# Patient Record
Sex: Female | Born: 1955 | Race: White | Hispanic: No | Smoking: Current every day smoker
Health system: Southern US, Community
[De-identification: ages and names within clinical notes are randomized; demographics above are authoritative.]

## PROBLEM LIST (undated history)

## (undated) DIAGNOSIS — I639 Cerebral infarction, unspecified: Secondary | ICD-10-CM

## (undated) DIAGNOSIS — I219 Acute myocardial infarction, unspecified: Secondary | ICD-10-CM

## (undated) DIAGNOSIS — R296 Repeated falls: Secondary | ICD-10-CM

## (undated) HISTORY — PX: CARPAL TUNNEL RELEASE: SHX101

## (undated) HISTORY — PX: ABDOMINAL HYSTERECTOMY: SHX81

---

## 2001-10-28 ENCOUNTER — Emergency Department (HOSPITAL_COMMUNITY): Admission: EM | Admit: 2001-10-28 | Discharge: 2001-10-29 | Payer: Self-pay | Admitting: Emergency Medicine

## 2001-10-29 ENCOUNTER — Encounter: Payer: Self-pay | Admitting: Emergency Medicine

## 2001-10-30 ENCOUNTER — Emergency Department (HOSPITAL_COMMUNITY): Admission: EM | Admit: 2001-10-30 | Discharge: 2001-10-31 | Payer: Self-pay | Admitting: Internal Medicine

## 2002-01-01 ENCOUNTER — Emergency Department (HOSPITAL_COMMUNITY): Admission: EM | Admit: 2002-01-01 | Discharge: 2002-01-01 | Payer: Self-pay | Admitting: *Deleted

## 2002-03-31 ENCOUNTER — Emergency Department (HOSPITAL_COMMUNITY): Admission: EM | Admit: 2002-03-31 | Discharge: 2002-04-01 | Payer: Self-pay | Admitting: Emergency Medicine

## 2002-04-01 ENCOUNTER — Encounter: Payer: Self-pay | Admitting: Emergency Medicine

## 2002-07-11 ENCOUNTER — Emergency Department (HOSPITAL_COMMUNITY): Admission: EM | Admit: 2002-07-11 | Discharge: 2002-07-12 | Payer: Self-pay | Admitting: Emergency Medicine

## 2002-08-29 ENCOUNTER — Emergency Department (HOSPITAL_COMMUNITY): Admission: EM | Admit: 2002-08-29 | Discharge: 2002-08-29 | Payer: Self-pay | Admitting: *Deleted

## 2004-02-13 ENCOUNTER — Emergency Department (HOSPITAL_COMMUNITY): Admission: EM | Admit: 2004-02-13 | Discharge: 2004-02-13 | Payer: Self-pay | Admitting: Emergency Medicine

## 2005-12-17 ENCOUNTER — Emergency Department (HOSPITAL_COMMUNITY): Admission: EM | Admit: 2005-12-17 | Discharge: 2005-12-17 | Payer: Self-pay | Admitting: Emergency Medicine

## 2006-09-13 ENCOUNTER — Emergency Department (HOSPITAL_COMMUNITY): Admission: EM | Admit: 2006-09-13 | Discharge: 2006-09-14 | Payer: Self-pay | Admitting: Emergency Medicine

## 2006-10-03 ENCOUNTER — Emergency Department (HOSPITAL_COMMUNITY): Admission: EM | Admit: 2006-10-03 | Discharge: 2006-10-03 | Payer: Self-pay | Admitting: Emergency Medicine

## 2006-11-12 ENCOUNTER — Emergency Department (HOSPITAL_COMMUNITY): Admission: EM | Admit: 2006-11-12 | Discharge: 2006-11-12 | Payer: Self-pay | Admitting: Emergency Medicine

## 2006-11-27 ENCOUNTER — Emergency Department (HOSPITAL_COMMUNITY): Admission: EM | Admit: 2006-11-27 | Discharge: 2006-11-27 | Payer: Self-pay | Admitting: Emergency Medicine

## 2007-05-08 ENCOUNTER — Emergency Department (HOSPITAL_COMMUNITY): Admission: EM | Admit: 2007-05-08 | Discharge: 2007-05-08 | Payer: Self-pay | Admitting: Emergency Medicine

## 2007-09-23 ENCOUNTER — Emergency Department (HOSPITAL_COMMUNITY): Admission: EM | Admit: 2007-09-23 | Discharge: 2007-09-23 | Payer: Self-pay | Admitting: Emergency Medicine

## 2008-08-30 ENCOUNTER — Emergency Department (HOSPITAL_COMMUNITY): Admission: EM | Admit: 2008-08-30 | Discharge: 2008-08-30 | Payer: Self-pay | Admitting: Emergency Medicine

## 2009-03-28 ENCOUNTER — Inpatient Hospital Stay (HOSPITAL_COMMUNITY): Admission: EM | Admit: 2009-03-28 | Discharge: 2009-03-30 | Payer: Self-pay | Admitting: Emergency Medicine

## 2009-04-22 ENCOUNTER — Emergency Department (HOSPITAL_COMMUNITY): Admission: EM | Admit: 2009-04-22 | Discharge: 2009-04-23 | Payer: Self-pay | Admitting: Emergency Medicine

## 2010-06-25 IMAGING — CR DG CHEST 2V
2 series · 2 of 2 positions shown · non-contrast
Comparison: 12/17/2005

CLINICAL DATA: Chest pain.  Vomiting and diarrhea.  Cough.

CHEST - 2 VIEW

[view not recorded (1 of 2)]
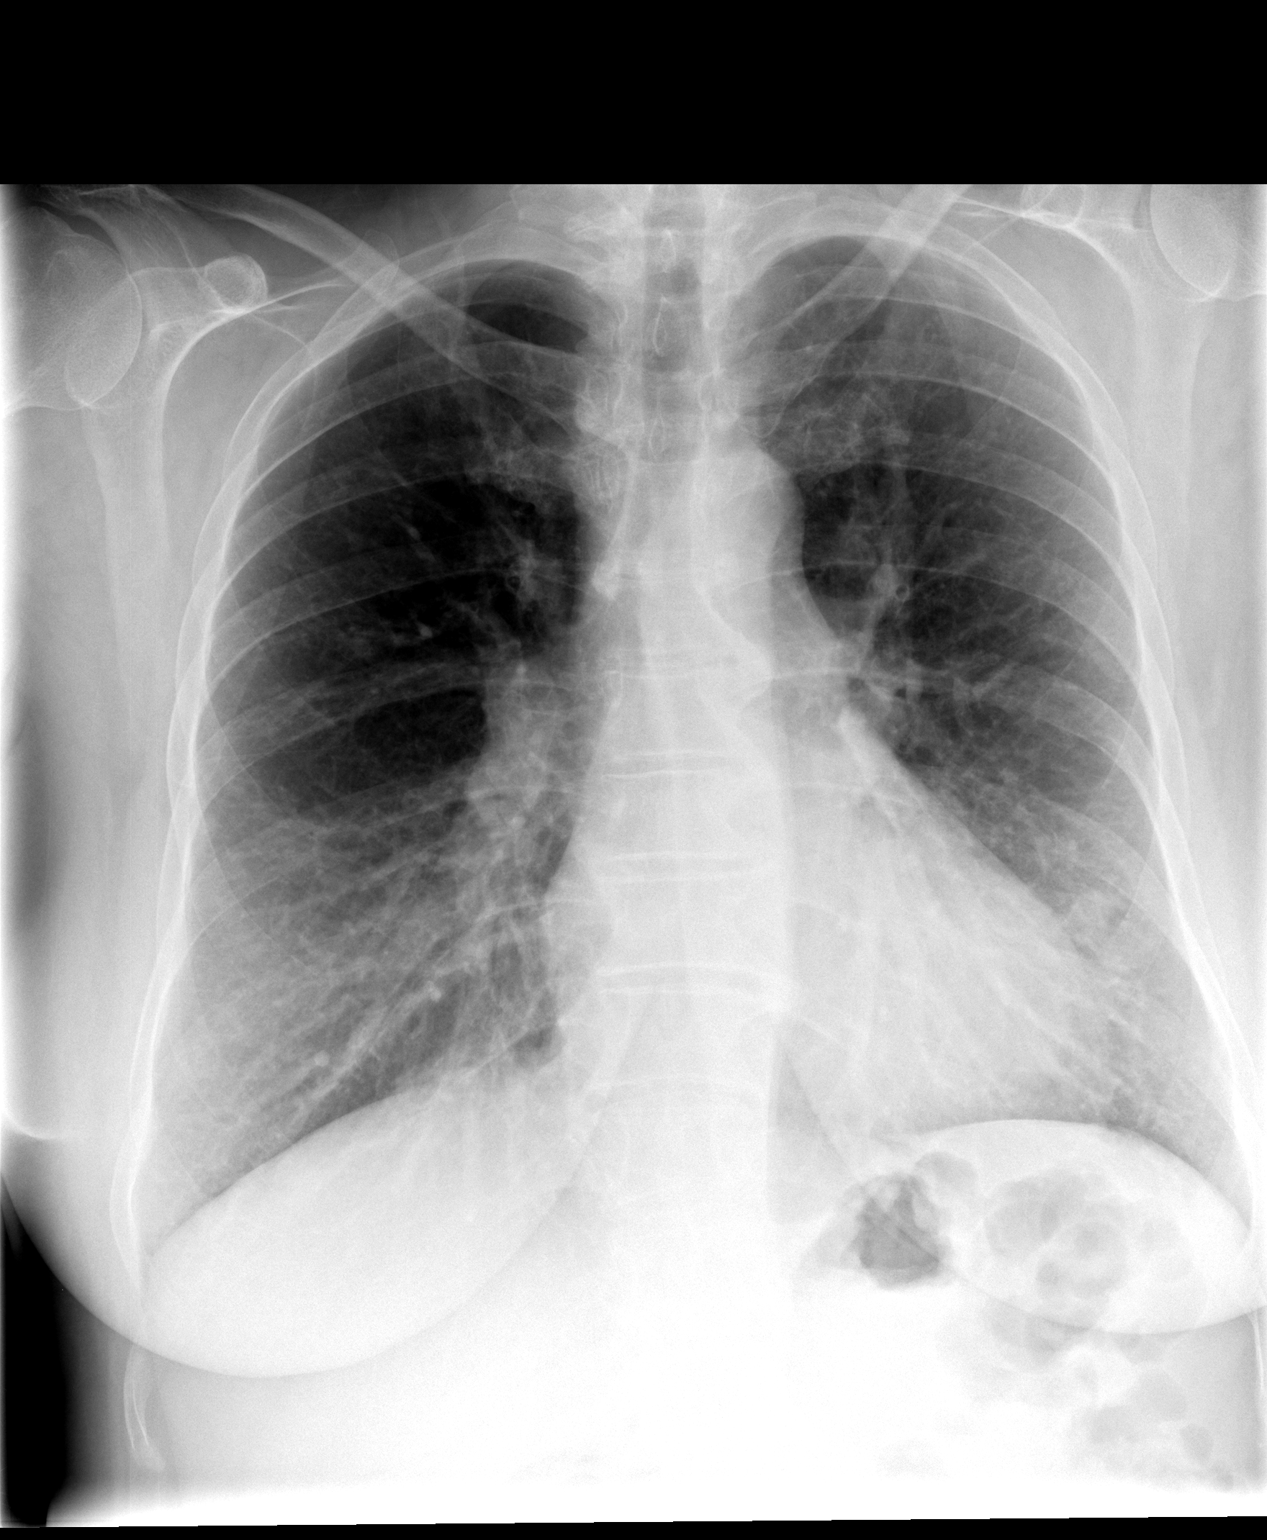

[view not recorded (2 of 2)]
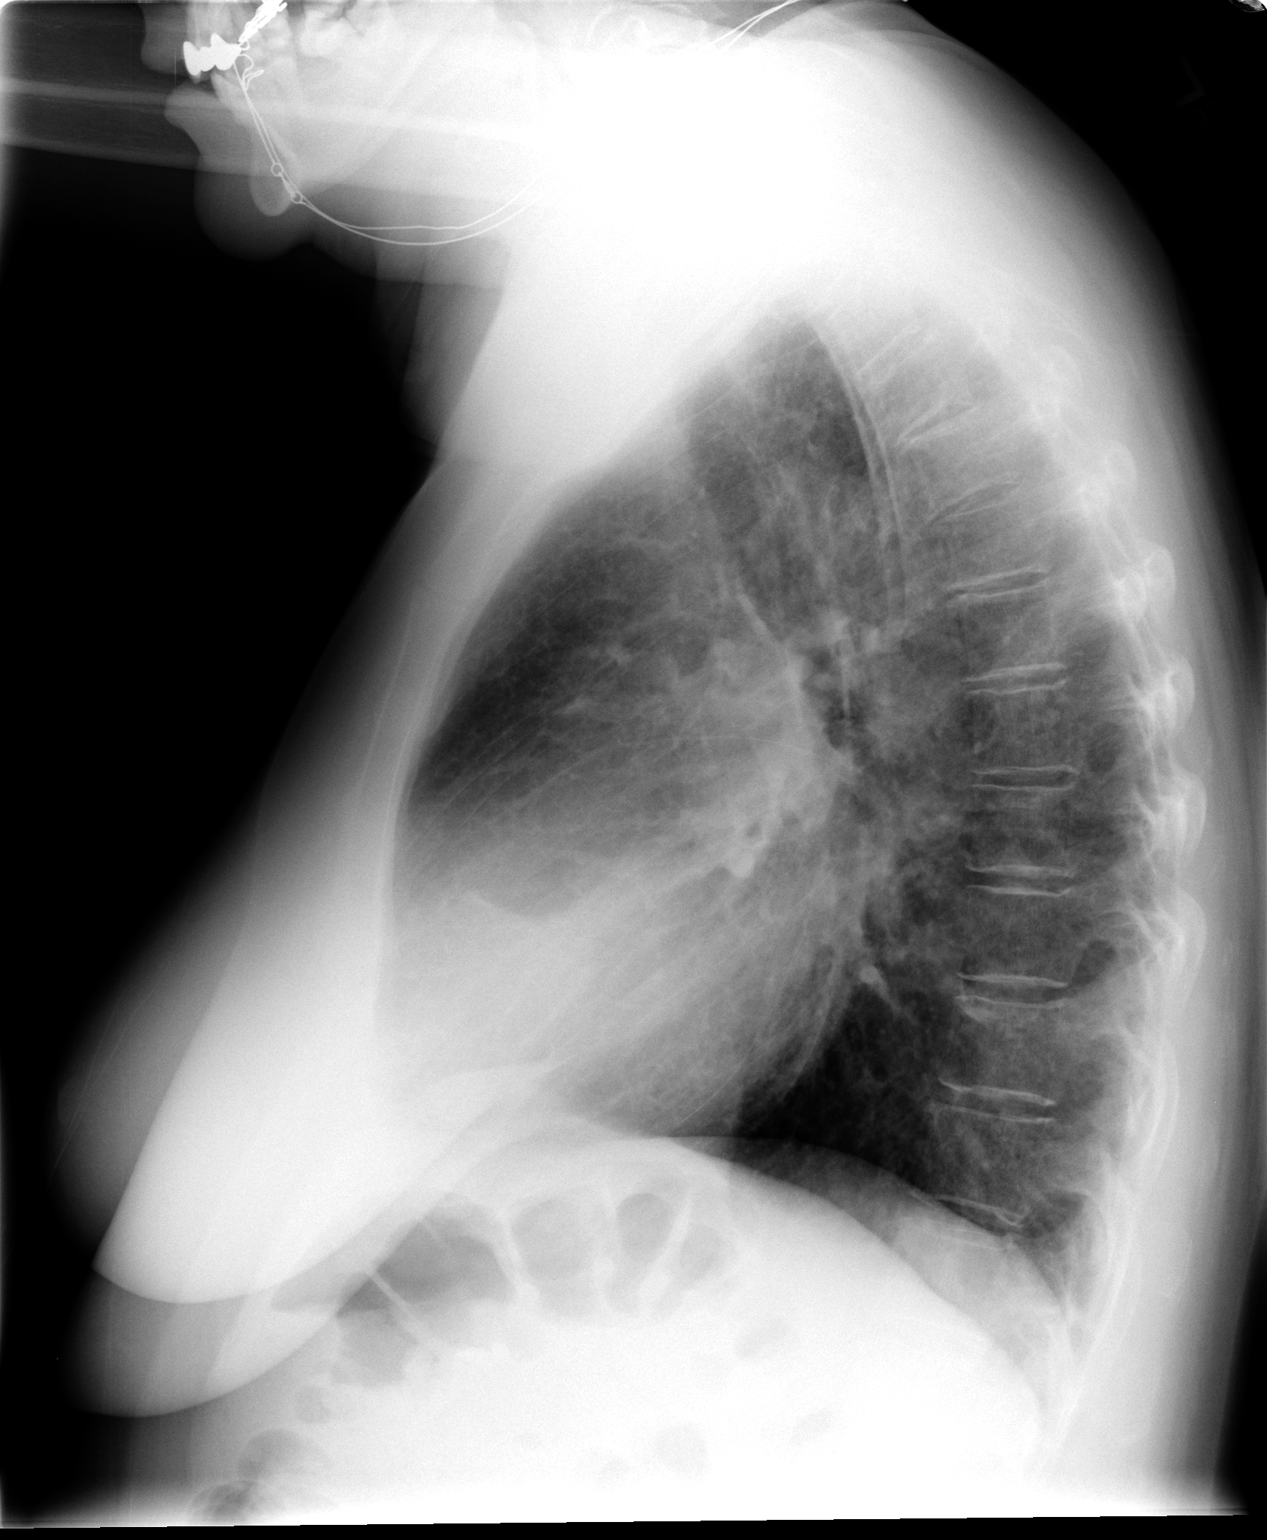

[2 of 2 positions shown; findings below may reference images not displayed]

FINDINGS: Midline trachea. Mild cardiomegaly. Mediastinal contours
otherwise within normal limits.  No pleural effusion or
pneumothorax. Diffuse peribronchial thickening.  Along the left
heart border, an area of increased density.  This may correspond to
a vague increased density over the heart on the lateral view.
IMPRESSION: 1.  Vague increased density within the lingula.  This may represent
confluence of moderate peribronchial thickening or atelectasis.
Given the history of cough, early infection cannot be excluded.
Recommend plain film follow-up.  If they are not infectious
symptoms, consider further evaluation with chest CT to exclude the
lung nodule.
2.  Cardiomegaly without congestive failure.
3. Peribronchial thickening which may relate to chronic bronchitis
or smoking.

## 2011-02-25 LAB — DIFFERENTIAL
Basophils Absolute: 0 10*3/uL (ref 0.0–0.1)
Eosinophils Absolute: 0 10*3/uL (ref 0.0–0.7)
Eosinophils Relative: 0 % (ref 0–5)
Eosinophils Relative: 0 % (ref 0–5)
Lymphocytes Relative: 11 % — ABNORMAL LOW (ref 12–46)
Lymphs Abs: 1 10*3/uL (ref 0.7–4.0)
Metamyelocytes Relative: 0 %
Monocytes Absolute: 0.1 10*3/uL (ref 0.1–1.0)
Monocytes Relative: 1 % — ABNORMAL LOW (ref 3–12)
Myelocytes: 0 %
Neutro Abs: 8.3 10*3/uL — ABNORMAL HIGH (ref 1.7–7.7)
Neutrophils Relative %: 73 % (ref 43–77)
Promyelocytes Absolute: 0 %
nRBC: 0 /100 WBC

## 2011-02-25 LAB — COMPREHENSIVE METABOLIC PANEL
ALT: 8 U/L (ref 0–35)
ALT: <8 U/L (ref 0–35)
AST: 10 U/L (ref 0–37)
AST: 14 U/L (ref 0–37)
Albumin: 2.8 g/dL — ABNORMAL LOW (ref 3.5–5.2)
Alkaline Phosphatase: 113 U/L (ref 39–117)
CO2: 30 mEq/L (ref 19–32)
Calcium: 9 mg/dL (ref 8.4–10.5)
Chloride: 107 mEq/L (ref 96–112)
Creatinine, Ser: 0.54 mg/dL (ref 0.4–1.2)
GFR calc Af Amer: >60 mL/min (ref >60)
GFR calc Af Amer: >60 mL/min (ref >60)
GFR calc non Af Amer: >60 mL/min (ref >60)
GFR calc non Af Amer: >60 mL/min (ref >60)
Glucose, Bld: 108 mg/dL — ABNORMAL HIGH (ref 70–99)
Potassium: 3.1 mEq/L — ABNORMAL LOW (ref 3.5–5.1)
Sodium: 143 mEq/L (ref 135–145)
Sodium: 144 mEq/L (ref 135–145)
Total Protein: 6.3 g/dL (ref 6.0–8.3)

## 2011-02-25 LAB — CBC
Hemoglobin: 14.3 g/dL (ref 12.0–15.0)
MCHC: 33.5 g/dL (ref 30.0–36.0)
MCV: 102 fL — ABNORMAL HIGH (ref 78.0–100.0)
Platelets: 323 10*3/uL (ref 150–400)
RBC: 4.1 MIL/uL (ref 3.87–5.11)
RDW: 16 % — ABNORMAL HIGH (ref 11.5–15.5)
WBC: 11.4 10*3/uL — ABNORMAL HIGH (ref 4.0–10.5)

## 2011-02-25 LAB — BLOOD GAS, ARTERIAL
Bicarbonate: 28.3 mEq/L — ABNORMAL HIGH (ref 20.0–24.0)
FIO2: 0.21 %
O2 Saturation: 86.2 %
Patient temperature: 37
pH, Arterial: 7.358 (ref 7.350–7.400)
pO2, Arterial: 49.2 mmHg — ABNORMAL LOW (ref 80.0–100.0)

## 2011-02-25 LAB — T4, FREE: Free T4: 0.9 ng/dL (ref 0.80–1.80)

## 2011-03-27 ENCOUNTER — Emergency Department (HOSPITAL_COMMUNITY): Payer: No Typology Code available for payment source

## 2011-03-27 ENCOUNTER — Emergency Department (HOSPITAL_COMMUNITY)
Admission: EM | Admit: 2011-03-27 | Discharge: 2011-03-27 | Disposition: A | Discharge status: Home / Self Care | Payer: No Typology Code available for payment source | Attending: Emergency Medicine | Admitting: Emergency Medicine

## 2011-03-27 DIAGNOSIS — R071 Chest pain on breathing: Secondary | ICD-10-CM | POA: Insufficient documentation

## 2011-03-27 DIAGNOSIS — M25569 Pain in unspecified knee: Secondary | ICD-10-CM | POA: Insufficient documentation

## 2011-03-27 DIAGNOSIS — IMO0002 Reserved for concepts with insufficient information to code with codable children: Secondary | ICD-10-CM | POA: Insufficient documentation

## 2011-03-27 DIAGNOSIS — Y92410 Unspecified street and highway as the place of occurrence of the external cause: Secondary | ICD-10-CM | POA: Insufficient documentation

## 2011-03-27 DIAGNOSIS — S298XXA Other specified injuries of thorax, initial encounter: Secondary | ICD-10-CM | POA: Insufficient documentation

## 2011-03-27 DIAGNOSIS — M542 Cervicalgia: Secondary | ICD-10-CM | POA: Insufficient documentation

## 2011-04-01 NOTE — Discharge Summary (Signed)
[name], [name]                 ACCOUNT NO.:  [id number]   MEDICAL RECORD NO.:  [id number]          PATIENT TYPE:  INP   LOCATION:  A314                          FACILITY:  [facility]   PHYSICIAN:  Osvaldo Shipper, MD     DATE OF BIRTH:  Jul 11, 1956   DATE OF ADMISSION:  03/28/2009  DATE OF DISCHARGE:  05/14/2010LH                               DISCHARGE SUMMARY   The patient does not have a primary medical doctor.   Please review the H and P dictated at the time of admission for details  regarding the patient's presenting illness.   DISCHARGE DIAGNOSES:  1. Acute bronchitis in the setting of tobacco abuse.  2. Gastroenteritis, resolved.  3. History of migraine headaches.  4. Macrocytosis with normal B12 and normal free T4.   BRIEF HOSPITAL COURSE:  Briefly, this is a 55 year old Caucasian female  who presented with complaints of shortness of breath and nausea,  vomiting, diarrhea.  She was felt to have two processes ongoing at the  same time.  She was thought to have acute bronchitis for which she was  treated with steroids, antibiotics, and nebulizer treatments.  She was  also thought to have acute gastroenteritis, which is thought to be  viral.  The gastroenteritis improved quickly; however, the bronchitis  persisted.  The patient continued to receive nebulizer treatments.  Over  the last 12 hours, she is feeling better.  Cough is improved.  Wheezing  is improved.  She is able to ambulate with no difficulties.  So, I think  at this point we can continue rest of the treatment at home.   On the day of discharge, otherwise she feels well.  She is requesting  narcotic pain medicines for her migraines, which I have declined.   PHYSICAL EXAMINATION:  VITAL SIGNS:  Temperature 97.9, heart rate 82,  blood pressure 144/89, respiratory rate is 20, saturation 98% on room  air.  LUNGS:  Scattered wheezing bilaterally.  No crackles are present.  CARDIOVASCULAR:  S1, S2 is normal, regular.   No murmurs appreciated.  No  S3, S4.  No rubs.  No bruits.  ABDOMEN:  Soft, nontender, nondistended.   LABORATORY DATA:  No labs done today.   IMAGING STUDIES DURING THIS ADMISSION:  Chest x-ray, which showed vague  increased density within the lingula and they could not exclude early  infection.  A followup chest x-ray was recommended, so we will write her  a prescription to get this chest x-ray in about 4-6 weeks.   DISCHARGE MEDICATIONS:  1. Amoxicillin 500 mg p.o. t.i.d. for 5 days.  2. Nicotine patch 21 mg per day.  3. Prednisone 40 mg daily for 3 days followed by 30 mg for 3 days,      followed 20 daily for 3 days, followed 10 mg daily for 3 days.  4. Albuterol nebulizers 2.5 mg every 6 hours for 5 days and then as      needed.   FOLLOWUP:  She does not have a primary medical doctor, and she is also  uninsured, here we will try  to arrange followup with one of the  unassigned physicians.   Chest x-ray should be obtained in 4-6 weeks and I will write her a  prescription for the same.  I will actually try to schedule an  appointment for her as well.   DIET:  No restrictions.   PHYSICAL ACTIVITY:  No restrictions.  She was counseled regarding  smoking cessation.   TOTAL TIME OF THIS ENCOUNTER:  35 minutes.      Osvaldo Shipper, MD  Electronically Signed     GK/MEDQ  D:  03/30/2009  T:  03/31/2009  Job:  [id number]

## 2011-04-01 NOTE — H&P (Signed)
[name], [name]                 ACCOUNT NO.:  [id number]   MEDICAL RECORD NO.:  [id number]          PATIENT TYPE:  INP   LOCATION:  A314                          FACILITY:  [facility]   PHYSICIAN:  Renee Ramus, MD       DATE OF BIRTH:  October 16, 1956   DATE OF ADMISSION:  03/28/2009  DATE OF DISCHARGE:  LH                              HISTORY & PHYSICAL   The patient does not have her primary care physician.  She is  unassigned.   HISTORY OF PRESENT ILLNESS:  The patient is a 55 year old female with  complaints of cough, mild fevers, and dyspnea x3-4 days prior to  admission.  In addition to that, she has had vomiting and diarrhea,  which has been nonbloody and occurring for approximately 4-5 days prior  to admission.  The patient does have known tobacco abuse history.  She  has not been formally diagnosed with COPD, but certainly has risk  factors for this.  She has been admitted for further evaluation and  treatment of likely COPD exacerbation.  The patient denies chest pain,  PND, or orthopnea.  The patient denies previous history of these  symptoms.  The patient denies bloody diarrhea.  Denies hematemesis.  The  patient is currently afebrile.   PAST MEDICAL HISTORY:  1. Migraine headaches.  2. Tobacco abuse.   SOCIAL HISTORY:  The patient reports smoking 1 pack per day.  Denies  alcohol use.  Lives alone at home.   FAMILY HISTORY:  Not available.   REVIEW OF SYSTEMS:  All other comprehensive review of systems are  negative.   The patient has no known drug allergies.   MEDICATIONS:  She is currently not taking any home medications.   PHYSICAL EXAMINATION:  GENERAL:  This is a well-developed, well-  nourished white female currently in no apparent distress.  VITAL SIGNS:  Blood pressure 96/50, heart rate 86, respiratory rate 20,  temperature 97.4.  HEENT:  No jugular venous distention or lymphadenopathy.  Oropharynx is  clear.  Mucous membranes pink and moist.  TMs clear  bilaterally.  Pupils  equal, round, and reactive to light and accommodation.  Extraocular  muscles were intact.  CARDIOVASCULAR:  Regular rate and rhythm without murmurs, rubs, or  gallops.  PULMONARY:  Lungs are clear to auscultation bilaterally with marked  decreased breath sounds in the basis.  ABDOMEN:  Soft, nontender, nondistended without hepatosplenomegaly.  Bowel sounds are present.  She has no rebound or guarding.  EXTREMITIES:  She has no clubbing, cyanosis, or edema.  She has good  peripheral pulses, dorsalis pedis, and radial arteries.  She is able to  move all extremities.  NEURO:  Cranial nerves II to XII are grossly intact.  She has no focal  neurological deficits.   STUDIES:  Chest x-ray shows chronic bronchitis with evidence of  cardiomegaly and stable left inferior lingular density.   LABORATORY DATA:  ABG done on room air shows pH 7.35, PO2 51, PCO2 49,  white count 11.4, H and H 14 and 41, MCV 100.7, and platelets 341.  Sodium 143, potassium 3.1, chloride 107, bicarb 30, BUN 4, creatinine  0.5, glucose 108.   ASSESSMENT AND PLAN:  1. Bronchitis.  The patient likely is experiencing acute chronic      obstructive pulmonary disease exacerbation with underlying      infection.  This may or may not be viral, but we will treat      empirically with antibiotics.  We will use albuterol and Atrovent      nebs.  We will also treat with steroids with taper and reevaluate      in a.m.  2. Tobacco abuse.  The patient will be placed on nicotine patch.  3. Nausea, vomiting, diarrhea, likely this is viral gastroenteritis.      The patient does not have history of colitis and currently does not      have evidence of bloody diarrhea.  I will place her on clear      liquids initially, advance as tolerated and give her Imodium for      her diarrhea.  4. Hypokalemia.  We use p.o. potassium replacement as well as 20 mEq/L      and IV fluid.  5. Macrocytosis.  We will check B12,  folate, and thyroid including TSH      and free T4.  6. Migraine headaches, currently stable.   DISPOSITION:  The patient is a full code.  H and P was constructed by  reviewing past medical history, confirming with emergency medical room  physician, and reviewing the emergency medical record.   TIME SPENT:  1 hour.      Renee Ramus, MD  Electronically Signed     JF/MEDQ  D:  03/28/2009  T:  03/29/2009  Job:  [id number]

## 2011-04-05 ENCOUNTER — Emergency Department (HOSPITAL_COMMUNITY)
Admission: EM | Admit: 2011-04-05 | Discharge: 2011-04-06 | Disposition: A | Discharge status: Home / Self Care | Payer: Self-pay | Attending: Emergency Medicine | Admitting: Emergency Medicine

## 2011-04-05 ENCOUNTER — Emergency Department (HOSPITAL_COMMUNITY): Payer: Self-pay

## 2011-04-05 DIAGNOSIS — R079 Chest pain, unspecified: Secondary | ICD-10-CM | POA: Insufficient documentation

## 2011-04-05 DIAGNOSIS — R071 Chest pain on breathing: Secondary | ICD-10-CM | POA: Insufficient documentation

## 2011-08-18 LAB — WOUND CULTURE: Gram Stain: NONE SEEN

## 2012-07-03 DIAGNOSIS — R0602 Shortness of breath: Secondary | ICD-10-CM

## 2012-11-10 ENCOUNTER — Encounter (HOSPITAL_COMMUNITY): Payer: Self-pay | Admitting: Emergency Medicine

## 2012-11-10 ENCOUNTER — Emergency Department (HOSPITAL_COMMUNITY)
Admission: EM | Admit: 2012-11-10 | Discharge: 2012-11-11 | Disposition: A | Discharge status: Home / Self Care | Payer: Self-pay | Attending: Emergency Medicine | Admitting: Emergency Medicine

## 2012-11-10 DIAGNOSIS — I252 Old myocardial infarction: Secondary | ICD-10-CM | POA: Insufficient documentation

## 2012-11-10 DIAGNOSIS — R634 Abnormal weight loss: Secondary | ICD-10-CM

## 2012-11-10 DIAGNOSIS — R112 Nausea with vomiting, unspecified: Secondary | ICD-10-CM

## 2012-11-10 DIAGNOSIS — R109 Unspecified abdominal pain: Secondary | ICD-10-CM

## 2012-11-10 DIAGNOSIS — F17200 Nicotine dependence, unspecified, uncomplicated: Secondary | ICD-10-CM | POA: Insufficient documentation

## 2012-11-10 HISTORY — DX: Acute myocardial infarction, unspecified: I21.9

## 2012-11-10 LAB — COMPREHENSIVE METABOLIC PANEL
ALT: 46 U/L — ABNORMAL HIGH (ref 0–35)
Alkaline Phosphatase: 186 U/L — ABNORMAL HIGH (ref 39–117)
BUN: 15 mg/dL (ref 6–23)
CO2: 30 mEq/L (ref 19–32)
Calcium: 9.3 mg/dL (ref 8.4–10.5)
GFR calc Af Amer: >90 mL/min (ref >90)
GFR calc non Af Amer: >90 mL/min (ref >90)
Glucose, Bld: 127 mg/dL — ABNORMAL HIGH (ref 70–99)
Sodium: 138 mEq/L (ref 135–145)

## 2012-11-10 LAB — CBC WITH DIFFERENTIAL/PLATELET
Basophils Relative: 0 % (ref 0–1)
Eosinophils Relative: 0 % (ref 0–5)
Lymphocytes Relative: 17 % (ref 12–46)
MCHC: 33.6 g/dL (ref 30.0–36.0)
Monocytes Absolute: 0.6 10*3/uL (ref 0.1–1.0)
Neutro Abs: 6 10*3/uL (ref 1.7–7.7)
Platelets: 358 10*3/uL (ref 150–400)
RDW: 13.5 % (ref 11.5–15.5)
WBC: 8 10*3/uL (ref 4.0–10.5)

## 2012-11-10 MED ORDER — METOCLOPRAMIDE HCL 5 MG/ML IJ SOLN
10.0000 mg | Freq: Once | INTRAMUSCULAR | Status: AC
  Administered 2012-11-10: 10 mg via INTRAVENOUS
  Filled 2012-11-10: qty 2

## 2012-11-10 MED ORDER — KETOROLAC TROMETHAMINE 30 MG/ML IJ SOLN
30.0000 mg | Freq: Once | INTRAMUSCULAR | Status: AC
  Administered 2012-11-10: 30 mg via INTRAVENOUS
  Filled 2012-11-10: qty 1

## 2012-11-10 MED ORDER — SODIUM CHLORIDE 0.9 % IV BOLUS (SEPSIS)
1000.0000 mL | Freq: Once | INTRAVENOUS | Status: AC
  Administered 2012-11-10: 1000 mL via INTRAVENOUS

## 2012-11-10 MED ORDER — DIPHENHYDRAMINE HCL 50 MG/ML IJ SOLN
12.5000 mg | Freq: Once | INTRAMUSCULAR | Status: AC
  Administered 2012-11-10: 12.5 mg via INTRAVENOUS
  Filled 2012-11-10: qty 1

## 2012-11-10 MED ORDER — DIPHENHYDRAMINE HCL 12.5 MG/5ML PO ELIX
12.5000 mg | ORAL_SOLUTION | Freq: Once | ORAL | Status: DC
  Filled 2012-11-10: qty 5

## 2012-11-10 NOTE — ED Notes (Signed)
Patient complaining of fever and vomiting for over 1 week.

## 2012-11-10 NOTE — ED Provider Notes (Signed)
History  This chart was scribed for Donnetta Hutching, MD by Manuela Schwartz, ED scribe. This patient was seen in room [facility]03/[facility] and the patient's care was started at 2040.   CSN: [id number]  Arrival date & time 11/10/12  2040   First MD Initiated Contact with Patient 11/10/12 2148      Chief Complaint  Patient presents with  . Fever  . Emesis   Patient is a 56 y.o. female presenting with fever and vomiting. The history is provided by the patient. No language interpreter was used.  Fever Primary symptoms of the febrile illness include nausea and vomiting. Primary symptoms do not include fever, shortness of breath or abdominal pain. The current episode started 6 to 7 days ago. This is a new problem. The problem has been gradually worsening.  Emesis  Pertinent negatives include no abdominal pain, no chills and no fever.   [name] is a 56 y.o. female who presents to the Emergency Department complaining of constant gradually worsening emesis episodes, fever, over the past week. She states also lost about 20 lbs over the past week. She states tried tylenol, vicodin, and Theraflu at home w/minimal relief.   She is an everyday smoker.  Past Medical History  Diagnosis Date  . Heart attack     Past Surgical History  Procedure Date  . Abdominal hysterectomy   . Carpal tunnel release     History reviewed. No pertinent family history.  History  Substance Use Topics  . Smoking status: Current Every Day Smoker  . Smokeless tobacco: Not on file  . Alcohol Use: No    OB History    Grav Para Term Preterm Abortions TAB SAB Ect Mult Living                  Review of Systems  Constitutional: Positive for unexpected weight change (lost approx 20 lbs over past week). Negative for fever and chills.  Respiratory: Negative for shortness of breath.   Cardiovascular: Negative for chest pain.  Gastrointestinal: Positive for nausea and vomiting. Negative for abdominal pain.  Neurological:  Negative for weakness.  All other systems reviewed and are negative.    Allergies  Review of patient's allergies indicates no known allergies.  Home Medications   Current Outpatient Rx  Name  Route  Sig  Dispense  Refill  . ACETAMINOPHEN 500 MG PO TABS   Oral   Take 1,000 mg by mouth once as needed. Taken a total of approx. 3 doses on 11/09/2012 For pain           Triage Vitals: BP 148/87  Pulse 91  Temp 98.1 F (36.7 C) (Oral)  Resp 16  Ht 5\' 6"  (1.676 m)  Wt 100 lb (45.36 kg)  BMI 16.14 kg/m2  SpO2 98%  LMP 03/26/2001  Physical Exam  Nursing note and vitals reviewed. Constitutional: She is oriented to person, place, and time. She appears well-developed and well-nourished.  HENT:  Head: Normocephalic and atraumatic.  Eyes: Conjunctivae normal and EOM are normal. Pupils are equal, round, and reactive to light.  Neck: Normal range of motion. Neck supple.  Cardiovascular: Normal rate, regular rhythm and normal heart sounds.   Pulmonary/Chest: Effort normal and breath sounds normal.  Abdominal: Soft. Bowel sounds are normal.  Musculoskeletal: Normal range of motion.  Neurological: She is alert and oriented to person, place, and time.  Skin: Skin is warm and dry. There is pallor (Minimally pale).  Psychiatric: She has a normal mood  and affect.    ED Course  Procedures (including critical care time) DIAGNOSTIC STUDIES: Oxygen Saturation is 98% on room air, normal by my interpretation.    COORDINATION OF CARE: At 1005 PM Discussed treatment plan with patient which includes fluids IV, Toradol, benadryl, blood work, UA. Patient agrees.   Labs Reviewed  CBC WITH DIFFERENTIAL - Abnormal; Notable for the following:    Hemoglobin 15.8 (*)     HCT 47.0 (*)     All other components within normal limits  COMPREHENSIVE METABOLIC PANEL - Abnormal; Notable for the following:    Potassium 3.4 (*)     Glucose, Bld 127 (*)     Albumin 3.4 (*)     AST 53 (*)     ALT 46 (*)      Alkaline Phosphatase 186 (*)     All other components within normal limits  LIPASE, BLOOD  URINALYSIS, ROUTINE W REFLEX MICROSCOPIC   No results found.   No diagnosis found.    MDM   Fever, nausea, vomiting for 7-10 days. Patient claims 15-20 pound weight loss. Liver functions slightly elevated. Will obtain contrasted CT scan of the abdomen and pelvis. Discussed with Dr. Colon Branch.  IV fluids, IV Benadryl, Reglan, Toradol. Patient resting comfortably at the moment   I personally performed the services described in this documentation, which was scribed in my presence. The recorded information has been reviewed and is accurate.          Donnetta Hutching, MD 11/10/12 [phone]

## 2012-11-11 ENCOUNTER — Emergency Department (HOSPITAL_COMMUNITY): Payer: Self-pay

## 2012-11-11 LAB — URINALYSIS, ROUTINE W REFLEX MICROSCOPIC
Protein, ur: NEGATIVE mg/dL
Urobilinogen, UA: 0.2 mg/dL (ref 0.0–1.0)

## 2012-11-11 MED ORDER — ONDANSETRON HCL 4 MG/2ML IJ SOLN
4.0000 mg | Freq: Once | INTRAMUSCULAR | Status: AC
  Administered 2012-11-11: 4 mg via INTRAVENOUS
  Filled 2012-11-11: qty 2

## 2012-11-11 NOTE — ED Notes (Signed)
Patient stating she cannot drink contrast due to nausea and the taste, Dr. Colon Branch notified. Stated to give zofran iv and encourage patient to drink contrast. Patient informed to CT stated would have to wait 1.5 hours after she drinks contrast to perform scan.

## 2012-11-11 NOTE — ED Notes (Signed)
CT staff mixed contrast in plain water and gave that to patient to see if she could drink that easier. Patient refused to drink contrast and stated she wanted her iv out and wanted to go home. Dr. Colon Branch notified, patient signed out AMA.

## 2012-11-11 NOTE — Progress Notes (Signed)
2358 Assumed care/disposition of patient. Patient with h/o nausea, vomiting illness x 10 days with 20 pound weight loss. Elevated LFTs. Awaiting CT scan of abdomen and pelvis.  0145 Nursing advised that patient is not drinking the contrast. She sates she is unable to drink that much and does not like the taste. Spoke with patient re need for contrast for CT. She refuses to try and drink any more of the flavored contrast. 0208 Patient had been taken unflavored contrast. She has refused to drink the contrast. With recent weight loss contrasted CT is the best study. She is asking to be discharged. She is willing to sign AMA paperwork.  discussed risk of death/disability of leaving against medical advice and the patient accepts these risks.  The patient is awake/alet able to make decisions, and not intoxicatedPatient discharged against medical advice.

## 2015-05-12 ENCOUNTER — Emergency Department (HOSPITAL_COMMUNITY): Payer: Self-pay

## 2015-05-12 ENCOUNTER — Emergency Department (HOSPITAL_COMMUNITY)
Admission: EM | Admit: 2015-05-12 | Discharge: 2015-05-13 | Disposition: A | Discharge status: Home / Self Care | Payer: Self-pay | Attending: Emergency Medicine | Admitting: Emergency Medicine

## 2015-05-12 ENCOUNTER — Encounter (HOSPITAL_COMMUNITY): Payer: Self-pay

## 2015-05-12 DIAGNOSIS — Z720 Tobacco use: Secondary | ICD-10-CM | POA: Insufficient documentation

## 2015-05-12 DIAGNOSIS — I252 Old myocardial infarction: Secondary | ICD-10-CM | POA: Insufficient documentation

## 2015-05-12 DIAGNOSIS — S0031XA Abrasion of nose, initial encounter: Secondary | ICD-10-CM | POA: Insufficient documentation

## 2015-05-12 DIAGNOSIS — Z7982 Long term (current) use of aspirin: Secondary | ICD-10-CM | POA: Insufficient documentation

## 2015-05-12 DIAGNOSIS — Y92009 Unspecified place in unspecified non-institutional (private) residence as the place of occurrence of the external cause: Secondary | ICD-10-CM | POA: Insufficient documentation

## 2015-05-12 DIAGNOSIS — Y998 Other external cause status: Secondary | ICD-10-CM | POA: Insufficient documentation

## 2015-05-12 DIAGNOSIS — S80211A Abrasion, right knee, initial encounter: Secondary | ICD-10-CM | POA: Insufficient documentation

## 2015-05-12 DIAGNOSIS — S80212A Abrasion, left knee, initial encounter: Secondary | ICD-10-CM | POA: Insufficient documentation

## 2015-05-12 DIAGNOSIS — Y9389 Activity, other specified: Secondary | ICD-10-CM | POA: Insufficient documentation

## 2015-05-12 DIAGNOSIS — W010XXA Fall on same level from slipping, tripping and stumbling without subsequent striking against object, initial encounter: Secondary | ICD-10-CM | POA: Insufficient documentation

## 2015-05-12 DIAGNOSIS — S0081XA Abrasion of other part of head, initial encounter: Secondary | ICD-10-CM | POA: Insufficient documentation

## 2015-05-12 DIAGNOSIS — Z79899 Other long term (current) drug therapy: Secondary | ICD-10-CM | POA: Insufficient documentation

## 2015-05-12 DIAGNOSIS — S80219A Abrasion, unspecified knee, initial encounter: Secondary | ICD-10-CM

## 2015-05-12 DIAGNOSIS — Z9181 History of falling: Secondary | ICD-10-CM | POA: Insufficient documentation

## 2015-05-12 DIAGNOSIS — W19XXXA Unspecified fall, initial encounter: Secondary | ICD-10-CM

## 2015-05-12 DIAGNOSIS — Z8673 Personal history of transient ischemic attack (TIA), and cerebral infarction without residual deficits: Secondary | ICD-10-CM | POA: Insufficient documentation

## 2015-05-12 HISTORY — DX: Repeated falls: R29.6

## 2015-05-12 HISTORY — DX: Cerebral infarction, unspecified: I63.9

## 2015-05-12 MED ORDER — ACETAMINOPHEN 500 MG PO TABS
1000.0000 mg | ORAL_TABLET | Freq: Once | ORAL | Status: AC
  Administered 2015-05-12: 1000 mg via ORAL
  Filled 2015-05-12: qty 2

## 2015-05-12 NOTE — Discharge Instructions (Signed)
°Emergency Department Resource Guide °1) Find a Doctor and Pay Out of Pocket °Although you won't have to find out who is covered by your insurance plan, it is a good idea to ask around and get recommendations. You will then need to call the office and see if the doctor you have chosen will accept you as a new patient and what types of options they offer for patients who are self-pay. Some doctors offer discounts or will set up payment plans for their patients who do not have insurance, but you will need to ask so you aren't surprised when you get to your appointment. ° °2) Contact Your Local Health Department °Not all health departments have doctors that can see patients for sick visits, but many do, so it is worth a call to see if yours does. If you don't know where your local health department is, you can check in your phone book. The CDC also has a tool to help you locate your state's health department, and many state websites also have listings of all of their local health departments. ° °3) Find a Walk-in Clinic °If your illness is not likely to be very severe or complicated, you may want to try a walk in clinic. These are popping up all over the country in pharmacies, drugstores, and shopping centers. They're usually staffed by nurse practitioners or physician assistants that have been trained to treat common illnesses and complaints. They're usually fairly quick and inexpensive. However, if you have serious medical issues or chronic medical problems, these are probably not your best option. ° °No Primary Care Doctor: °- Call [facility] at  832-8000 - they can help you locate a primary care doctor that  accepts your insurance, provides certain services, etc. °- Physician Referral Service- 1-800-533-3463 ° °Chronic Pain Problems: °Organization         Address  Phone   Notes  °[facility]  (336) 297-2271 Patients need to be referred by their primary care doctor.  ° °Medication  Assistance: °Organization         Address  Phone   Notes  °[place] [facility] [address] °[place], [place] [address] (336) 641-8030 --Must be a resident of [place] °-- Must have NO insurance coverage whatsoever (no Medicaid/ Medicare, etc.) °-- The pt. MUST have a primary care doctor that directs their care regularly and follows them in the community °  °[facility]  (866) 331-1348   °[employer]  (888) 892-1162   ° °Agencies that provide inexpensive medical care: °Organization         Address  Phone   Notes  °[facility]  (336) 832-8035   °[facility]    (336) 832-7272   °[facility] [address] °[place], [place] [address] (336) 832-4777   °[facility] [address], °[place] (336) 271-4999   °[facility]    (336) 373-0678   °[facility]    (336) 272-1050   °[facility] ° [address], [place] Phone:  (336) 832-4444, Fax:  (336) 832-4440 Hours of Operation:  9 am - 6 pm, M-F.  Also accepts Medicaid/Medicare and self-pay.  °[facility] ° [address], [place] Phone: (336) 832-3150, Fax: (336) [phone]. Hours of Operation:  8:30 am - 5:30 pm, M-F.  Also accepts Medicaid and self-pay.  °[facility] [place] [address], [place] Phone: (336) 878-6027   °[facility] [address], [place], [place] (336)723-1848, Ext. 123 Mondays & Thursdays: 7-9 AM.  First 15 patients are seen on a first come, first serve basis. °  ° °Medicaid-accepting [place] Providers: ° °Organization         Address  Phone   Notes  °[facility] [address], [place] (336) 641-2100 Also accepts self-pay patients.  °[facility] [address], [place] ° (336) 856-9996   °[facility] [address], [place]  (336) 288-8857   °[facility] [address] [place] [address], [place] (336) 299-7000   °Veita Bland [address], [place]  ° (336) 373-1557 Only accepts [place] Access Medicaid patients after they have their name applied to their card.  ° °Self-Pay (no insurance) in [place]: ° °Organization         Address  Phone   Notes  °Sickle Cell Patients, [facility] [address], [place] (336) 832-1970   °[facility] [address], [place] (336) 832-4400   °[facility] ° [address], [place] (336) 992-4800   °[facility]/Dr. Osei-Bonsu ° [address] [place] [address], [place] or [address], [place] (336) [phone] Phone number for both [place] and [place] locations is the same.  °[facility] [address], [place] (336) 299-0000   °[facility] [address] [place] [address], [place] or [address] (336) 852-7530 °(336) 878-2260   °[facility] [address], [place] (336) 350-1642, phone; (336) 294-5005, fax Sees patients 1st and 3rd Saturday of every month.  Must not qualify for public or private insurance (i.e. Medicaid, Medicare, [place] Health Choice, Veterans' Benefits) • Household income should be no more than 200% of the poverty level •The clinic cannot treat you if you are pregnant or think you are pregnant • Sexually transmitted diseases are not treated at the clinic.  ° ° °Dental Care: °Organization         Address  Phone  Notes  °[place] [facility] [address], [place] (336) 641-6152 Accepts children up to age 21 who are enrolled in Medicaid or [place] Health Choice; pregnant women with a Medicaid card; and children who have applied for Medicaid or [place] Health Choice, but were declined, whose parents can pay a reduced fee at time of service.  °[place]  [facility] [place]  [address], [place] (336) 641-7733 Accepts children up to age 21 who are enrolled in Medicaid or [place] Health Choice; pregnant women with a Medicaid card; and children who have applied for Medicaid or [place] Health Choice, but were declined, whose parents can pay a reduced fee at time of service.  °[facility] ° [address], [place] (336) 641-4533 Patients are seen by appointment only. Walk-ins are not accepted. [facility] will see patients 18 years of age and older. °Monday - Tuesday (8am-5pm) °Most Wednesdays (8:30-5pm) °$30 per visit, cash only  °[facility] ° [address], [place] (336) 641-4533 Patients are seen by appointment only. Walk-ins are not accepted. [facility] will see patients 18 years of age and older. °One   Wednesday Evening (Monthly: Volunteer Based).  $30 per visit, cash only  °[facility]  (919) 537-3737 for adults; Children under age 4, call Graduate Pediatric Dentistry at ([phone]) [phone]. Children aged 4-14, please call ([phone]) [phone] to request a pediatric application. ° Dental services are provided in all areas of dental care including fillings, crowns and bridges, complete and partial dentures, implants, gum treatment, root canals, and extractions. Preventive care is also provided. Treatment is provided to both adults and children. °Patients are selected via a lottery and there is often a waiting list. °  °[facility] [address], °[place] ° (336) 763-8833 [email or web address] °  °[facility] [address], [place], [place] (336)723-1848, Ext. 123 Second and Fourth Thursday of each month, opens at 6:30 AM; Clinic ends at 9 AM.  Patients are seen on a first-come first-served basis, and a limited number are seen during each clinic.  ° °[facility] ° [address], [place], [place] (336) 723-7904    Eligibility Requirements °You must have lived in [place], [place], or [place] counties for at least the last three months. °  You cannot be eligible for state or federal sponsored healthcare insurance, including Veterans Administration, Medicaid, or Medicare. °  You generally cannot be eligible for healthcare insurance through your employer.  °  How to apply: °Eligibility screenings are held every Tuesday and Wednesday afternoon from 1:00 pm until 4:00 pm. You do not need an appointment for the interview!  °[facility] [address], [place], [place] 336-631-2330   °[facility]  336-342-8273   °[facility]  336-703-3100   °[facility]  336-570-6415   ° °Behavioral Health Resources in the Community: °Intensive Outpatient Programs °Organization         Address  Phone  Notes  °[facility] [address], [place], [place] 336-878-6098   °[facility] [address], [place], [place] 336-832-9800   °[facility] [address], [place], [place] ° 336-882-2125   °[facility] [address],  °[place], [place] 1-800-853-5163 or 336-641-4981   °Substance Abuse Resources °Organization         Address  Phone  Notes  °[facility]  336-882-2125   °[facility]  336-784-9470   °[facility]  336-285-9073   °[facility]  336-845-3988   °Residential & Outpatient Substance Abuse Program  1-800-659-3381   °Psychological Services °Organization         Address  Phone  Notes  °[facility]  336- 832-9600   °[facility]  336- 378-7881   °[facility] [address], [place] 1-800-853-5163 or 336-641-4981   ° °Mobile Crisis Teams °Organization         Address  Phone  Notes  °[facility], Mobile Crisis Care Unit  1-877-626-1772   °Assertive °[facility] ° [address]  [place], [place] 336-834-9664   °Sharon DeEsch [address] °[place] 336-554-5454   ° °Self-Help/Support Groups °Organization         Address  Phone             Notes  °[facility]  - variety of support groups  336- 373-1402 Call for more information  °Narcotics Anonymous (NA), [facility] [address], °[place]  2 meetings at this location  ° °  Residential Treatment Programs Organization         Address  Phone  Notes  [facility] [address]    [place]  3-790-240-9735   [facility]  [address] [place] [address], [place], [place] 268-341-9622   [facility] [address] [place], [place] 297-989-2119 Admissions: 8am-3pm M-F  [facility] [address]    [place], [place] 417-408-1448   [facility] [address] [place], [place], [place] 185-631-4970   [facility] [address]  [place], [place] 263-785-8850   [facility] - Intensive Outpatient [address], [place], [place] 277-412-8786   [facility] ([facility] [address] [place]  [place], [place] 7-672-094-7096 or 9565367899   [facility] [address], [place], [place] 546-503-5465 Accepts Medicaid  [facility] [place] [address],  [place] 6-812-751-7001 Substance Abuse/Addiction Treatment   [facility]         Address  Phone  Notes  [facility]  7723564006   Angie Fava, PhD [address] [place], [place]   (873)027-2796 or 6095268066   [facility]   [address] [place], [place] (512) 294-6243   [facility] [address], [place], [place] 515-375-6199 Insurance/Medicaid/sponsorship through [facility] and Families [address]                                    [place], [place] 440-010-4828 Therapy/tele-psych/case    [facility] [address], [place] 787-529-4023    Dr. Lolly Mustache  949-069-7129   [facility] [place]  [employer] [facility] Dept. 1) [address] [place] [address] [place] 2) [address], [place] 3)  [address], [place] 434-034-1343 205-520-7321  204-181-8138   [facility] Child Abuse Hotline 5071439325 or 937-477-1221 (After Hours)      Take over the counter tylenol, as directed on packaging, as needed for discomfort. Wash the abraded areas with soap and water at least twice a day, and cover with a clean/dry dressing.  Change the dressing whenever it becomes wet or soiled after washing the area with soap and water.  Apply moist heat or ice to the area(s) of discomfort, for 15 minutes at a time, several times per day for the next few days.  Do not fall asleep on a heating or ice pack.  Call your regular medical doctor on Monday to schedule a follow up appointment this week.  Return to the Emergency Department immediately if worsening.

## 2015-05-12 NOTE — ED Notes (Signed)
Patient lost balance and fell face first onto ground. Patient has abrasions and hematoma to face/forehead and bilateral knees. Patient denies LOC. Patient has history of CVA with speech and balance deficits.

## 2015-05-12 NOTE — ED Provider Notes (Signed)
CSN: [id number]     Arrival date & time 05/12/15  1951 History   First MD Initiated Contact with Patient 05/12/15 2048     Chief Complaint  Patient presents with  . Fall      Patient is a 59 y.o. female presenting with fall. The history is provided by the patient and a relative. The history is limited by the condition of the patient (Hx aphasia ).  Fall  Pt was seen at 2120. Per pt and her family: Pt s/p fall PTA. Pt's son states he let the dog loose and pt "got in the way of the dog." Pt "lost her balance" and "fell over" face first to the ground. Family found her laying on the ground. Pt c/o face "pain," bilat knee pain and multiple abrasions. Pt was ambulatory after the fall. Pt's family states pt has hx of frequent falls since her CVA. Denies LOC, no AMS from baseline. Denies CP/SOB, no abd pain, no focal motor weakness.   Td UTD per family Past Medical History  Diagnosis Date  . Heart attack   . Frequent falls   . Stroke     aphasia, frequent falls   Past Surgical History  Procedure Laterality Date  . Abdominal hysterectomy    . Carpal tunnel release      History  Substance Use Topics  . Smoking status: Current Every Day Smoker -- 1.00 packs/day    Types: Cigarettes  . Smokeless tobacco: Not on file  . Alcohol Use: No    Review of Systems  Unable to perform ROS: Patient nonverbal      Allergies  Review of patient's allergies indicates no known allergies.  Home Medications   Prior to Admission medications   Medication Sig Start Date End Date Taking? Authorizing Provider  ALPRAZolam Prudy Feeler) 0.5 MG tablet Take 0.5 mg by mouth 3 (three) times daily as needed for anxiety.   Yes Historical Provider, MD  aspirin 81 MG chewable tablet Chew 81 mg by mouth daily.   Yes Historical Provider, MD   BP 146/95 mmHg  Pulse 106  Temp(Src) 98.8 F (37.1 C) (Oral)  Resp 18  Ht 5\' 7"  (1.702 m)  Wt 161 lb (73.029 kg)  BMI 25.21 kg/m2  SpO2 97%  LMP 03/26/2001 Physical  Exam  2125: Physical examination: Vital signs and O2 SAT: Reviewed; Constitutional: Well developed, Well nourished, Well hydrated, In no acute distress; Head and Face: Normocephalic, No scalp hematomas, no lacs. +superficial abrasions to forehead and nose.  Non-tender to palp superior and inferior orbital rim areas.  No zygoma tenderness.  No mandibular tenderness.; Eyes: EOMI, PERRL, No scleral icterus; ENMT: Mouth and pharynx normal, Left TM normal, Right TM normal, Mucous membranes moist, +teeth and tongue intact.  No intraoral or intranasal bleeding.  No septal hematomas.  No trismus, no malocclusion.;  Neck: Supple, Trachea midline; Spine: No midline CS, TS, LS tenderness.; Cardiovascular: Regular rate and rhythm, No gallop; Respiratory: Breath sounds clear & equal bilaterally, No wheezes, Normal respiratory effort/excursion; Chest: Nontender, No deformity, Movement normal, No crepitus, No abrasions or ecchymosis.; Abdomen: Soft, Nontender, Nondistended, Normal bowel sounds, No abrasions or ecchymosis.; Genitourinary: No CVA tenderness;; Extremities: +FROM bilat knees, including able to lift extended bilat LE off stretcher, and extend bilat lower leg against resistance.  No ligamentous laxity.  No patellar or quad tendon step-offs.  NMS intact bilat feet, strong pedal pp. +plantarflexion of right and left foot w/calf squeeze.  No palpable gap right and left Achilles's  tendons.  No bilateral proximal fibular head tenderness.  No edema, erythema, warmth, ecchymosis or deformity.  +tender superficial abrasions bilat patellar areas. No specific area of point tenderness bilat knees. NT bilat hips/ankles/feet. Otherwise full range of motion major/large joints of bilat UE's and LE's without pain or tenderness to palp, Neurovascularly intact, Pulses normal, No deformity. No edema, Pelvis stable; Neuro: AA&Ox3, GCS 15.  Major CN grossly intact. Speech minimal per baseline. No gross focal motor or sensory deficits in  extremities.; Skin: Color normal, Warm, Dry    ED Course  Procedures     EKG Interpretation None      MDM  MDM Reviewed: previous chart, nursing note and vitals Interpretation: x-ray and CT scan      Ct Head Wo Contrast 05/12/2015   CLINICAL DATA:  Lost balance and fell face first onto ground. Abrasions and scalp hematoma along the forehead. Concern for head or cervical spine injury. Initial encounter.  EXAM: CT HEAD WITHOUT CONTRAST  CT MAXILLOFACIAL WITHOUT CONTRAST  CT CERVICAL SPINE WITHOUT CONTRAST  TECHNIQUE: Multidetector CT imaging of the head, cervical spine, and maxillofacial structures were performed using the standard protocol without intravenous contrast. Multiplanar CT image reconstructions of the cervical spine and maxillofacial structures were also generated.  COMPARISON:  CTA of the head performed 01/31/2015, and CT of the cervical spine performed 03/27/2011  FINDINGS: CT HEAD FINDINGS  There is no evidence of acute infarction, mass lesion, or intra- or extra-axial hemorrhage on CT.  A large chronic infarct is again noted at the left MCA territory. Scattered periventricular white matter change likely reflects small vessel ischemic microangiopathy. Prominence of the ventricles and sulci reflects mild cortical volume loss. Postoperative change is noted at the anterior right temporal lobe, with an associated craniotomy flap.  The brainstem and fourth ventricle are within normal limits. No mass effect or midline shift is seen.  There is no evidence of fracture. The orbits are within normal limits. Mucosal thickening is noted at the maxillary sinuses bilaterally, at the right side of the sphenoid sinus, and at the ethmoid air cells. The remaining paranasal sinuses and mastoid air cells are well-aerated. Soft tissue swelling is noted overlying the right frontal calvarium.  CT MAXILLOFACIAL FINDINGS  There is no evidence of fracture or dislocation. The maxilla and mandible appear  intact. The nasal bone is unremarkable in appearance. Multiple maxillary and mandibular dental caries are seen. A craniotomy flap is noted at the right temporoparietal calvarium.  The orbits are intact bilaterally. Mucosal thickening is seen at the maxillary sinuses bilaterally, at the right side of the sphenoid sinus, and at the ethmoid air cells. The remaining visualized paranasal sinuses and mastoid air cells are well-aerated.  Soft tissue swelling is seen overlying the right frontal calvarium. Scattered calcification is seen at the carotid bifurcations bilaterally. The parapharyngeal fat planes are preserved. The nasopharynx, oropharynx and hypopharynx are unremarkable in appearance. The visualized portions of the valleculae and piriform sinuses are grossly unremarkable.  The parotid and submandibular glands are within normal limits. No cervical lymphadenopathy is seen.  CT CERVICAL SPINE FINDINGS  There is no evidence of fracture or subluxation. Vertebral bodies demonstrate normal height and alignment. Intervertebral disc spaces are preserved. A few anterior disc osteophyte complexes are noted at the lower cervical spine. Mild underlying facet disease is noted. Prevertebral soft tissues are within normal limits. The visualized neural foramina are grossly unremarkable.  The thyroid gland is unremarkable in appearance. The visualized lung apices are clear. No  significant soft tissue abnormalities are seen.  IMPRESSION: 1. No evidence of traumatic intracranial injury or fracture. 2. No evidence of fracture or dislocation with regard to the maxillofacial structures. 3. No evidence of fracture or subluxation along the cervical spine. 4. Soft tissue swelling overlying the right frontal calvarium. 5. Multiple maxillary and mandibular dental caries noted. 6. Large chronic infarct again noted at the left MCA territory. Scattered small vessel ischemic microangiopathy. Mild cortical volume loss noted. 7. Mucosal  thickening at the maxillary sinuses bilaterally, and at the right side of the sphenoid sinus. 8. Scattered calcification at the carotid bifurcations bilaterally. Carotid ultrasound could be considered for further evaluation, when and as deemed clinically appropriate.   Electronically Signed   By: Roanna Raider M.D.   On: 05/12/2015 22:42   Ct Cervical Spine Wo Contrast 05/12/2015   CLINICAL DATA:  Lost balance and fell face first onto ground. Abrasions and scalp hematoma along the forehead. Concern for head or cervical spine injury. Initial encounter.  EXAM: CT HEAD WITHOUT CONTRAST  CT MAXILLOFACIAL WITHOUT CONTRAST  CT CERVICAL SPINE WITHOUT CONTRAST  TECHNIQUE: Multidetector CT imaging of the head, cervical spine, and maxillofacial structures were performed using the standard protocol without intravenous contrast. Multiplanar CT image reconstructions of the cervical spine and maxillofacial structures were also generated.  COMPARISON:  CTA of the head performed 01/31/2015, and CT of the cervical spine performed 03/27/2011  FINDINGS: CT HEAD FINDINGS  There is no evidence of acute infarction, mass lesion, or intra- or extra-axial hemorrhage on CT.  A large chronic infarct is again noted at the left MCA territory. Scattered periventricular white matter change likely reflects small vessel ischemic microangiopathy. Prominence of the ventricles and sulci reflects mild cortical volume loss. Postoperative change is noted at the anterior right temporal lobe, with an associated craniotomy flap.  The brainstem and fourth ventricle are within normal limits. No mass effect or midline shift is seen.  There is no evidence of fracture. The orbits are within normal limits. Mucosal thickening is noted at the maxillary sinuses bilaterally, at the right side of the sphenoid sinus, and at the ethmoid air cells. The remaining paranasal sinuses and mastoid air cells are well-aerated. Soft tissue swelling is noted overlying the right  frontal calvarium.  CT MAXILLOFACIAL FINDINGS  There is no evidence of fracture or dislocation. The maxilla and mandible appear intact. The nasal bone is unremarkable in appearance. Multiple maxillary and mandibular dental caries are seen. A craniotomy flap is noted at the right temporoparietal calvarium.  The orbits are intact bilaterally. Mucosal thickening is seen at the maxillary sinuses bilaterally, at the right side of the sphenoid sinus, and at the ethmoid air cells. The remaining visualized paranasal sinuses and mastoid air cells are well-aerated.  Soft tissue swelling is seen overlying the right frontal calvarium. Scattered calcification is seen at the carotid bifurcations bilaterally. The parapharyngeal fat planes are preserved. The nasopharynx, oropharynx and hypopharynx are unremarkable in appearance. The visualized portions of the valleculae and piriform sinuses are grossly unremarkable.  The parotid and submandibular glands are within normal limits. No cervical lymphadenopathy is seen.  CT CERVICAL SPINE FINDINGS  There is no evidence of fracture or subluxation. Vertebral bodies demonstrate normal height and alignment. Intervertebral disc spaces are preserved. A few anterior disc osteophyte complexes are noted at the lower cervical spine. Mild underlying facet disease is noted. Prevertebral soft tissues are within normal limits. The visualized neural foramina are grossly unremarkable.  The thyroid gland is unremarkable  in appearance. The visualized lung apices are clear. No significant soft tissue abnormalities are seen.  IMPRESSION: 1. No evidence of traumatic intracranial injury or fracture. 2. No evidence of fracture or dislocation with regard to the maxillofacial structures. 3. No evidence of fracture or subluxation along the cervical spine. 4. Soft tissue swelling overlying the right frontal calvarium. 5. Multiple maxillary and mandibular dental caries noted. 6. Large chronic infarct again noted  at the left MCA territory. Scattered small vessel ischemic microangiopathy. Mild cortical volume loss noted. 7. Mucosal thickening at the maxillary sinuses bilaterally, and at the right side of the sphenoid sinus. 8. Scattered calcification at the carotid bifurcations bilaterally. Carotid ultrasound could be considered for further evaluation, when and as deemed clinically appropriate.   Electronically Signed   By: Roanna Raider M.D.   On: 05/12/2015 22:42   Dg Knee Complete 4 Views Left 05/12/2015   CLINICAL DATA:  Lost balance and fell to ground, bilateral knee abrasions. History of stroke and gait imbalance.  EXAM: LEFT KNEE - COMPLETE 4+ VIEW; RIGHT KNEE - COMPLETE 4+ VIEW  COMPARISON:  LEFT knee radiograph Mar 27, 2011  FINDINGS: No acute fracture deformity or dislocation. Joint space intact without erosions. Mild bilateral patellofemoral compartment joint space narrowing, consistent with osteoarthrosis. No destructive bony lesions. Osteopenia. Soft tissue planes are not suspicious. Mild vascular calcifications.  IMPRESSION: No acute fracture deformity or dislocation. Patient is osteopenic, decreasing sensitivity for acute nondisplaced fractures.  Mild bilateral patellofemoral compartment osteoarthrosis.   Electronically Signed   By: Awilda Metro M.D.   On: 05/12/2015 23:04   Dg Knee Complete 4 Views Right 05/12/2015   CLINICAL DATA:  Lost balance and fell to ground, bilateral knee abrasions. History of stroke and gait imbalance.  EXAM: LEFT KNEE - COMPLETE 4+ VIEW; RIGHT KNEE - COMPLETE 4+ VIEW  COMPARISON:  LEFT knee radiograph Mar 27, 2011  FINDINGS: No acute fracture deformity or dislocation. Joint space intact without erosions. Mild bilateral patellofemoral compartment joint space narrowing, consistent with osteoarthrosis. No destructive bony lesions. Osteopenia. Soft tissue planes are not suspicious. Mild vascular calcifications.  IMPRESSION: No acute fracture deformity or dislocation. Patient is  osteopenic, decreasing sensitivity for acute nondisplaced fractures.  Mild bilateral patellofemoral compartment osteoarthrosis.   Electronically Signed   By: Awilda Metro M.D.   On: 05/12/2015 23:04   Ct Maxillofacial Wo Cm 05/12/2015   CLINICAL DATA:  Lost balance and fell face first onto ground. Abrasions and scalp hematoma along the forehead. Concern for head or cervical spine injury. Initial encounter.  EXAM: CT HEAD WITHOUT CONTRAST  CT MAXILLOFACIAL WITHOUT CONTRAST  CT CERVICAL SPINE WITHOUT CONTRAST  TECHNIQUE: Multidetector CT imaging of the head, cervical spine, and maxillofacial structures were performed using the standard protocol without intravenous contrast. Multiplanar CT image reconstructions of the cervical spine and maxillofacial structures were also generated.  COMPARISON:  CTA of the head performed 01/31/2015, and CT of the cervical spine performed 03/27/2011  FINDINGS: CT HEAD FINDINGS  There is no evidence of acute infarction, mass lesion, or intra- or extra-axial hemorrhage on CT.  A large chronic infarct is again noted at the left MCA territory. Scattered periventricular white matter change likely reflects small vessel ischemic microangiopathy. Prominence of the ventricles and sulci reflects mild cortical volume loss. Postoperative change is noted at the anterior right temporal lobe, with an associated craniotomy flap.  The brainstem and fourth ventricle are within normal limits. No mass effect or midline shift is seen.  There is  no evidence of fracture. The orbits are within normal limits. Mucosal thickening is noted at the maxillary sinuses bilaterally, at the right side of the sphenoid sinus, and at the ethmoid air cells. The remaining paranasal sinuses and mastoid air cells are well-aerated. Soft tissue swelling is noted overlying the right frontal calvarium.  CT MAXILLOFACIAL FINDINGS  There is no evidence of fracture or dislocation. The maxilla and mandible appear intact. The  nasal bone is unremarkable in appearance. Multiple maxillary and mandibular dental caries are seen. A craniotomy flap is noted at the right temporoparietal calvarium.  The orbits are intact bilaterally. Mucosal thickening is seen at the maxillary sinuses bilaterally, at the right side of the sphenoid sinus, and at the ethmoid air cells. The remaining visualized paranasal sinuses and mastoid air cells are well-aerated.  Soft tissue swelling is seen overlying the right frontal calvarium. Scattered calcification is seen at the carotid bifurcations bilaterally. The parapharyngeal fat planes are preserved. The nasopharynx, oropharynx and hypopharynx are unremarkable in appearance. The visualized portions of the valleculae and piriform sinuses are grossly unremarkable.  The parotid and submandibular glands are within normal limits. No cervical lymphadenopathy is seen.  CT CERVICAL SPINE FINDINGS  There is no evidence of fracture or subluxation. Vertebral bodies demonstrate normal height and alignment. Intervertebral disc spaces are preserved. A few anterior disc osteophyte complexes are noted at the lower cervical spine. Mild underlying facet disease is noted. Prevertebral soft tissues are within normal limits. The visualized neural foramina are grossly unremarkable.  The thyroid gland is unremarkable in appearance. The visualized lung apices are clear. No significant soft tissue abnormalities are seen.  IMPRESSION: 1. No evidence of traumatic intracranial injury or fracture. 2. No evidence of fracture or dislocation with regard to the maxillofacial structures. 3. No evidence of fracture or subluxation along the cervical spine. 4. Soft tissue swelling overlying the right frontal calvarium. 5. Multiple maxillary and mandibular dental caries noted. 6. Large chronic infarct again noted at the left MCA territory. Scattered small vessel ischemic microangiopathy. Mild cortical volume loss noted. 7. Mucosal thickening at the  maxillary sinuses bilaterally, and at the right side of the sphenoid sinus. 8. Scattered calcification at the carotid bifurcations bilaterally. Carotid ultrasound could be considered for further evaluation, when and as deemed clinically appropriate.   Electronically Signed   By: Roanna Raider M.D.   On: 05/12/2015 22:42    1135:  XR/CT without fx. Family prefers pt NOT receive any medications that would further cause instability, AMS, etc. Family agrees tylenol most appropriate to tx pt's pain. Td UTD per family. Wound care provided to abrasions. Pt and family would like to go home now. Dx and testing d/w pt and family.  Questions answered.  Verb understanding, agreeable to d/c home with outpt f/u.   Samuel Jester, DO 05/15/15 2206

## 2015-05-12 NOTE — ED Notes (Signed)
Pt complaining of anxiety and generalized pain rated 10/10 on pain scale. Dr. Conley Rolls paged. Waiting on response.

## 2015-05-13 NOTE — ED Notes (Signed)
Pt left ED, ambulatory with husband with no signs of distress. Pt and family verbalized discharge instructions. Family noted that this ED is very nice and nurses are very nice.

## 2015-05-22 ENCOUNTER — Ambulatory Visit (HOSPITAL_COMMUNITY)
Admission: RE | Admit: 2015-05-22 | Discharge: 2015-05-22 | Disposition: A | Discharge status: Home / Self Care | Payer: Self-pay | Source: Ambulatory Visit | Attending: Family Medicine | Admitting: Family Medicine

## 2015-05-22 ENCOUNTER — Other Ambulatory Visit (HOSPITAL_COMMUNITY): Payer: Self-pay | Admitting: Family Medicine

## 2015-05-22 DIAGNOSIS — M25521 Pain in right elbow: Secondary | ICD-10-CM | POA: Insufficient documentation

## 2015-05-22 DIAGNOSIS — W19XXXA Unspecified fall, initial encounter: Secondary | ICD-10-CM | POA: Insufficient documentation

## 2016-08-08 IMAGING — DX DG KNEE COMPLETE 4+V*R*
4 series · 4 of 4 positions shown · non-contrast
Comparison: LEFT knee radiograph March 27, 2011

CLINICAL DATA: Lost balance and fell to ground, bilateral knee
abrasions. History of stroke and gait imbalance.

EXAM:
LEFT KNEE - COMPLETE 4+ VIEW; RIGHT KNEE - COMPLETE 4+ VIEW

[knee ap]
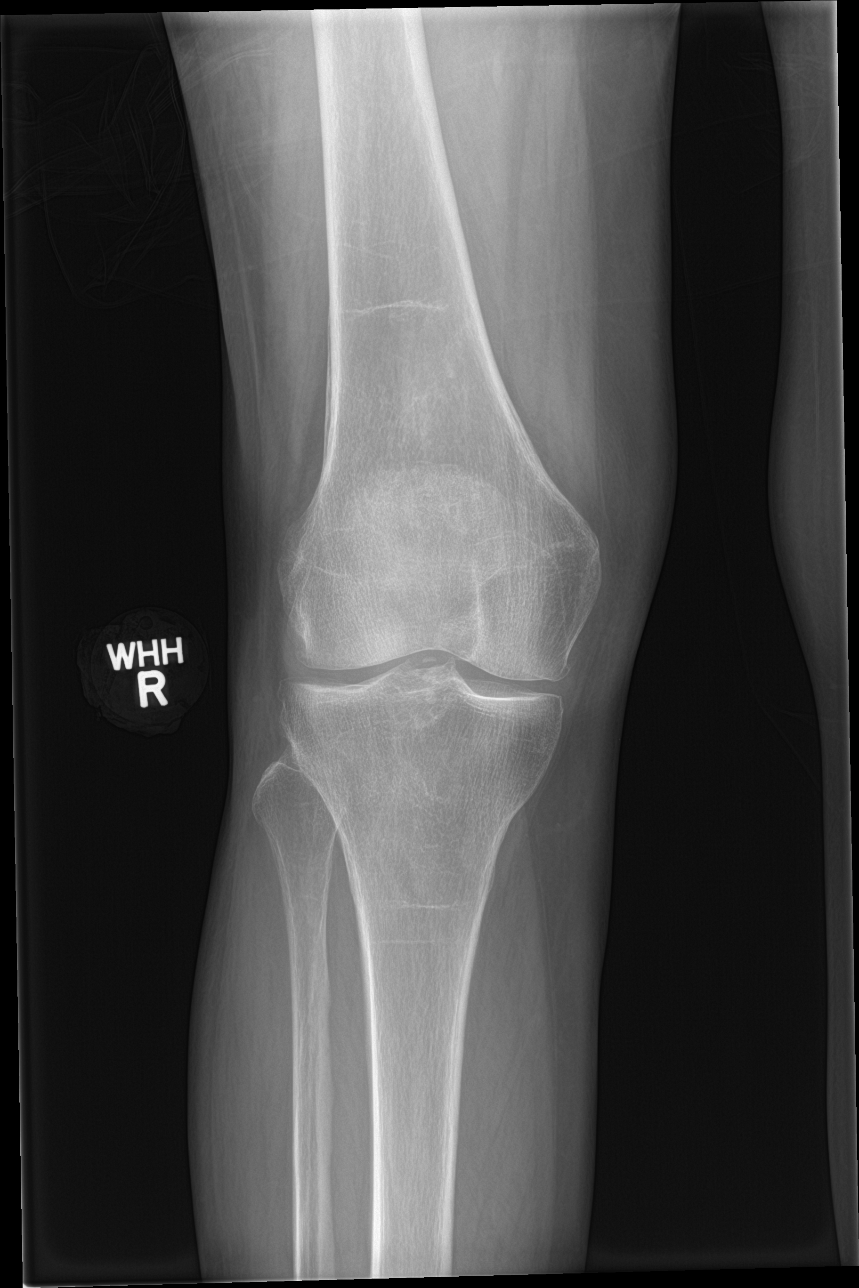

[knee obl (1 of 2)]
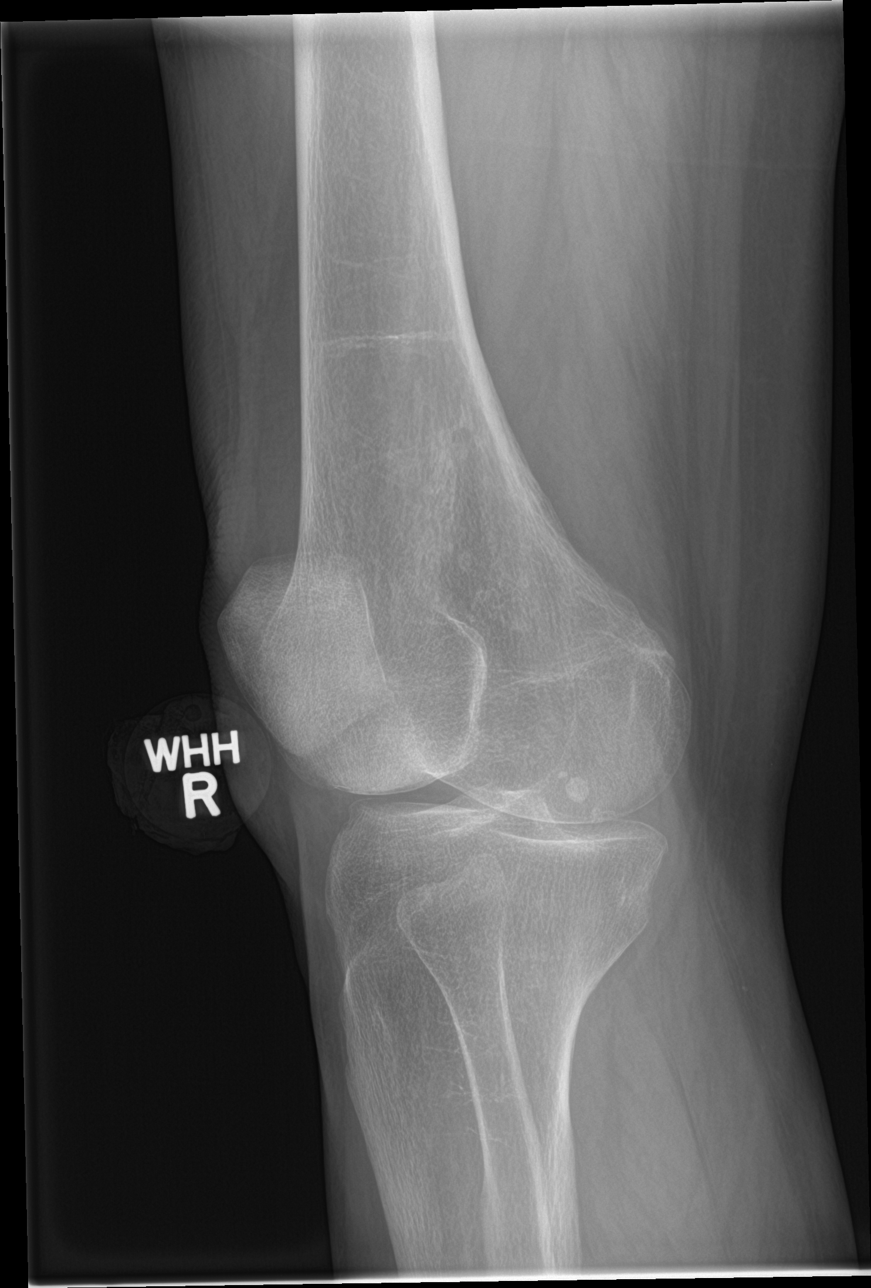

[knee obl (2 of 2)]
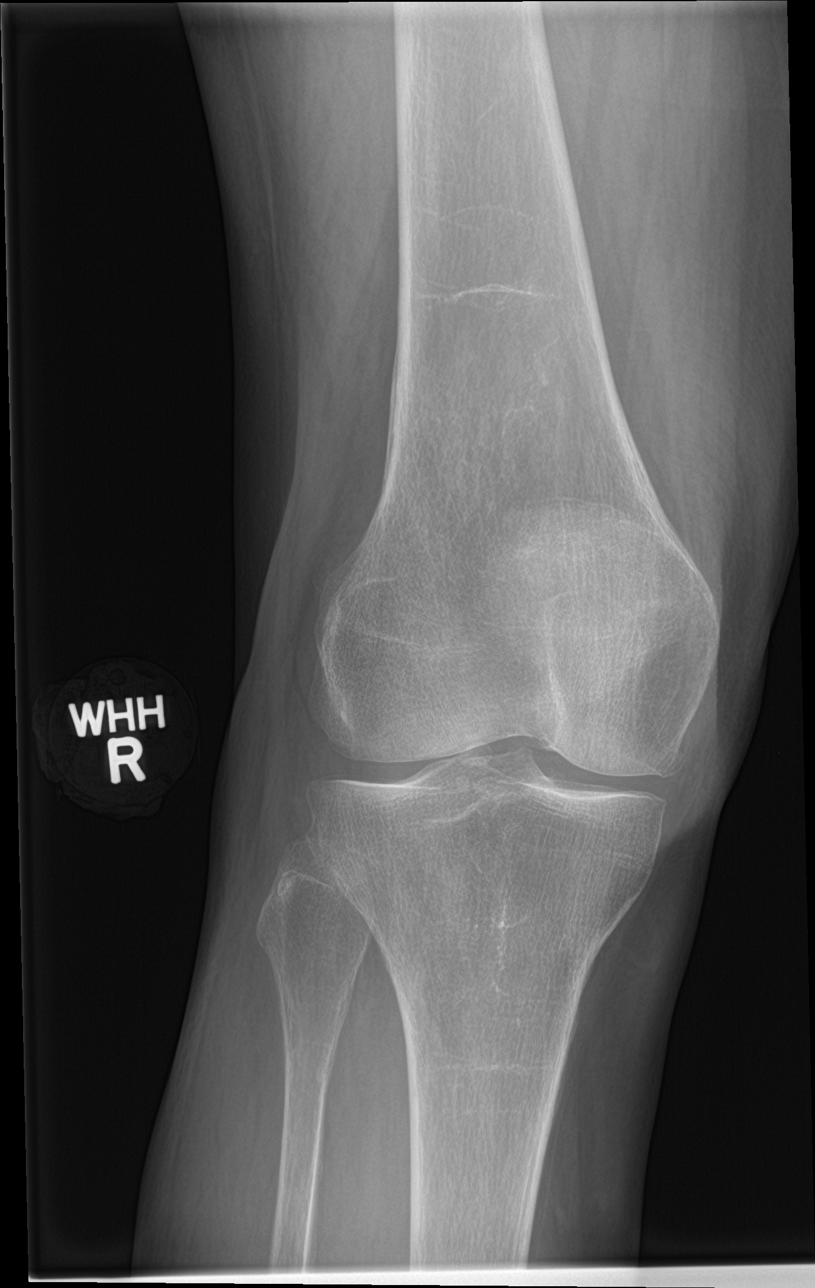

[knee lat]
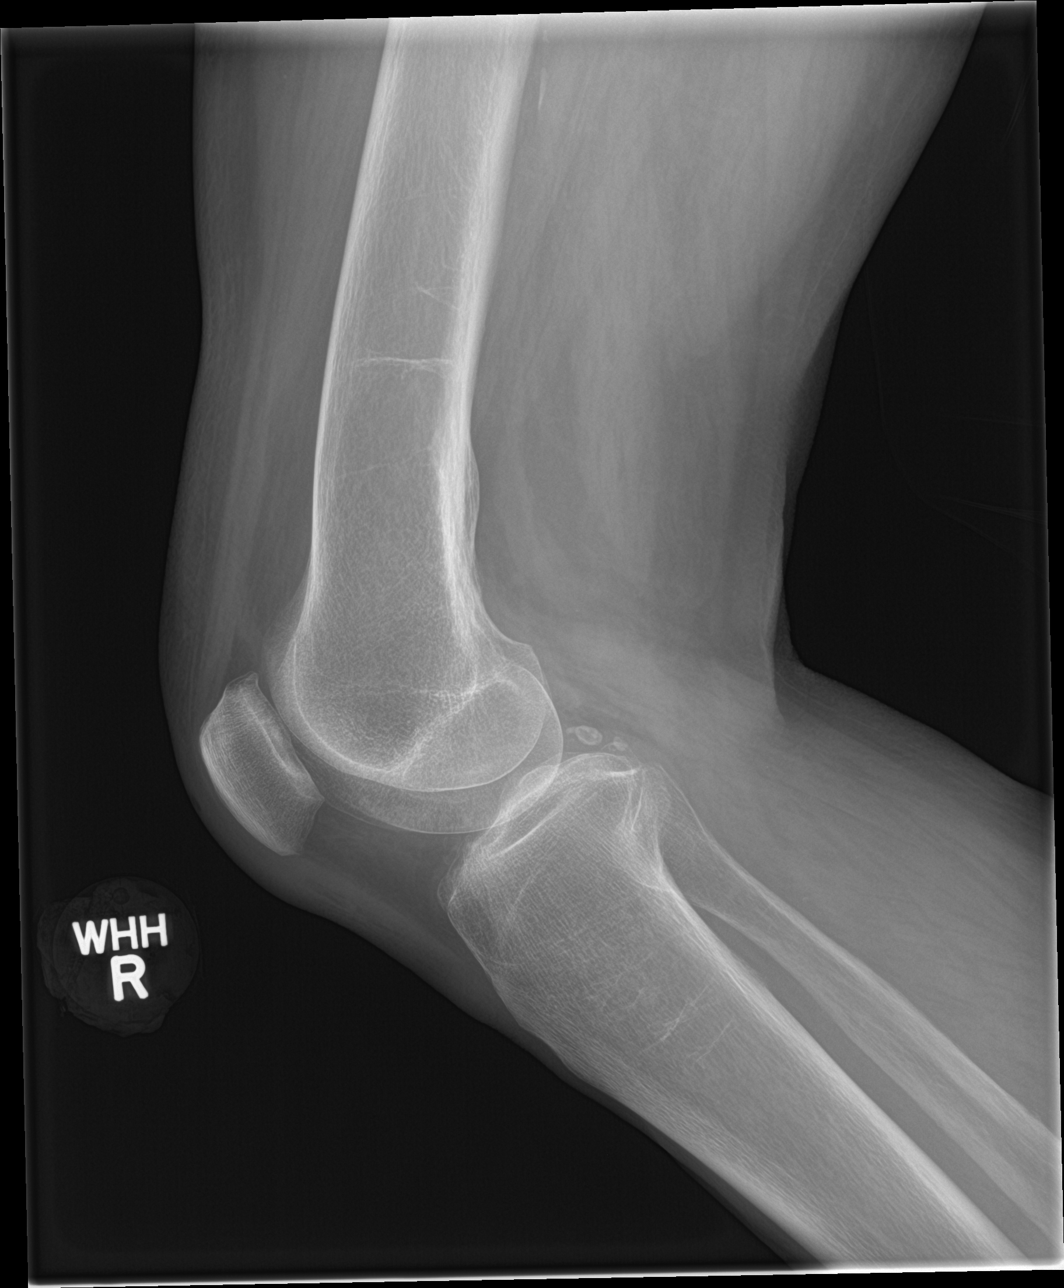

[4 of 4 positions shown; findings below may reference images not displayed]

FINDINGS: No acute fracture deformity or dislocation. Joint space intact
without erosions. Mild bilateral patellofemoral compartment joint
space narrowing, consistent with osteoarthrosis. No destructive bony
lesions. Osteopenia. Soft tissue planes are not suspicious. Mild
vascular calcifications.
IMPRESSION: No acute fracture deformity or dislocation. Patient is osteopenic,
decreasing sensitivity for acute nondisplaced fractures.

Mild bilateral patellofemoral compartment osteoarthrosis.

## 2017-04-03 DIAGNOSIS — F3289 Other specified depressive episodes: Secondary | ICD-10-CM | POA: Diagnosis not present

## 2017-04-03 DIAGNOSIS — I638 Other cerebral infarction: Secondary | ICD-10-CM | POA: Diagnosis not present

## 2017-04-03 DIAGNOSIS — E784 Other hyperlipidemia: Secondary | ICD-10-CM | POA: Diagnosis not present

## 2017-04-03 DIAGNOSIS — I10 Essential (primary) hypertension: Secondary | ICD-10-CM | POA: Diagnosis not present

## 2017-05-06 DIAGNOSIS — G40802 Other epilepsy, not intractable, without status epilepticus: Secondary | ICD-10-CM | POA: Diagnosis not present

## 2017-05-06 DIAGNOSIS — S299XXA Unspecified injury of thorax, initial encounter: Secondary | ICD-10-CM | POA: Diagnosis not present

## 2017-05-06 DIAGNOSIS — I69328 Other speech and language deficits following cerebral infarction: Secondary | ICD-10-CM | POA: Diagnosis not present

## 2017-05-06 DIAGNOSIS — Z7982 Long term (current) use of aspirin: Secondary | ICD-10-CM | POA: Diagnosis not present

## 2017-05-06 DIAGNOSIS — F3289 Other specified depressive episodes: Secondary | ICD-10-CM | POA: Diagnosis not present

## 2017-05-06 DIAGNOSIS — Z90710 Acquired absence of both cervix and uterus: Secondary | ICD-10-CM | POA: Diagnosis not present

## 2017-05-06 DIAGNOSIS — I69351 Hemiplegia and hemiparesis following cerebral infarction affecting right dominant side: Secondary | ICD-10-CM | POA: Diagnosis not present

## 2017-05-06 DIAGNOSIS — F329 Major depressive disorder, single episode, unspecified: Secondary | ICD-10-CM | POA: Diagnosis not present

## 2017-05-06 DIAGNOSIS — Z9049 Acquired absence of other specified parts of digestive tract: Secondary | ICD-10-CM | POA: Diagnosis not present

## 2017-05-06 DIAGNOSIS — R41841 Cognitive communication deficit: Secondary | ICD-10-CM | POA: Diagnosis not present

## 2017-05-06 DIAGNOSIS — J449 Chronic obstructive pulmonary disease, unspecified: Secondary | ICD-10-CM | POA: Diagnosis not present

## 2017-05-06 DIAGNOSIS — F411 Generalized anxiety disorder: Secondary | ICD-10-CM | POA: Diagnosis not present

## 2017-05-06 DIAGNOSIS — R51 Headache: Secondary | ICD-10-CM | POA: Diagnosis not present

## 2017-05-06 DIAGNOSIS — G40909 Epilepsy, unspecified, not intractable, without status epilepticus: Secondary | ICD-10-CM | POA: Diagnosis not present

## 2017-05-06 DIAGNOSIS — Z8249 Family history of ischemic heart disease and other diseases of the circulatory system: Secondary | ICD-10-CM | POA: Diagnosis not present

## 2017-05-06 DIAGNOSIS — Z780 Asymptomatic menopausal state: Secondary | ICD-10-CM | POA: Diagnosis not present

## 2017-05-06 DIAGNOSIS — S0990XA Unspecified injury of head, initial encounter: Secondary | ICD-10-CM | POA: Diagnosis not present

## 2017-05-06 DIAGNOSIS — E785 Hyperlipidemia, unspecified: Secondary | ICD-10-CM | POA: Diagnosis not present

## 2017-05-06 DIAGNOSIS — M542 Cervicalgia: Secondary | ICD-10-CM | POA: Diagnosis not present

## 2017-05-06 DIAGNOSIS — W19XXXA Unspecified fall, initial encounter: Secondary | ICD-10-CM | POA: Diagnosis not present

## 2017-05-06 DIAGNOSIS — R062 Wheezing: Secondary | ICD-10-CM | POA: Diagnosis not present

## 2017-05-06 DIAGNOSIS — F17200 Nicotine dependence, unspecified, uncomplicated: Secondary | ICD-10-CM | POA: Diagnosis not present

## 2017-05-06 DIAGNOSIS — R55 Syncope and collapse: Secondary | ICD-10-CM | POA: Diagnosis not present

## 2017-05-06 DIAGNOSIS — Z79899 Other long term (current) drug therapy: Secondary | ICD-10-CM | POA: Diagnosis not present

## 2017-05-07 DIAGNOSIS — F3289 Other specified depressive episodes: Secondary | ICD-10-CM | POA: Diagnosis not present

## 2017-05-07 DIAGNOSIS — I69351 Hemiplegia and hemiparesis following cerebral infarction affecting right dominant side: Secondary | ICD-10-CM | POA: Diagnosis not present

## 2017-05-07 DIAGNOSIS — G40802 Other epilepsy, not intractable, without status epilepticus: Secondary | ICD-10-CM | POA: Diagnosis not present

## 2017-05-07 DIAGNOSIS — R55 Syncope and collapse: Secondary | ICD-10-CM | POA: Diagnosis not present

## 2017-05-07 DIAGNOSIS — I69328 Other speech and language deficits following cerebral infarction: Secondary | ICD-10-CM | POA: Diagnosis not present

## 2017-07-01 DIAGNOSIS — S42414A Nondisplaced simple supracondylar fracture without intercondylar fracture of right humerus, initial encounter for closed fracture: Secondary | ICD-10-CM | POA: Diagnosis not present

## 2017-07-01 DIAGNOSIS — G8191 Hemiplegia, unspecified affecting right dominant side: Secondary | ICD-10-CM | POA: Diagnosis not present

## 2017-07-01 DIAGNOSIS — G8111 Spastic hemiplegia affecting right dominant side: Secondary | ICD-10-CM | POA: Diagnosis not present

## 2017-07-01 DIAGNOSIS — W01198A Fall on same level from slipping, tripping and stumbling with subsequent striking against other object, initial encounter: Secondary | ICD-10-CM | POA: Diagnosis not present

## 2017-07-01 DIAGNOSIS — W1839XA Other fall on same level, initial encounter: Secondary | ICD-10-CM | POA: Diagnosis not present

## 2017-07-01 DIAGNOSIS — J441 Chronic obstructive pulmonary disease with (acute) exacerbation: Secondary | ICD-10-CM | POA: Diagnosis not present

## 2017-07-01 DIAGNOSIS — J449 Chronic obstructive pulmonary disease, unspecified: Secondary | ICD-10-CM | POA: Diagnosis not present

## 2017-07-01 DIAGNOSIS — S299XXA Unspecified injury of thorax, initial encounter: Secondary | ICD-10-CM | POA: Diagnosis not present

## 2017-07-01 DIAGNOSIS — Z8673 Personal history of transient ischemic attack (TIA), and cerebral infarction without residual deficits: Secondary | ICD-10-CM | POA: Diagnosis not present

## 2017-07-01 DIAGNOSIS — S42411A Displaced simple supracondylar fracture without intercondylar fracture of right humerus, initial encounter for closed fracture: Secondary | ICD-10-CM | POA: Diagnosis not present

## 2017-07-01 DIAGNOSIS — F17200 Nicotine dependence, unspecified, uncomplicated: Secondary | ICD-10-CM | POA: Diagnosis not present

## 2017-07-01 DIAGNOSIS — R0902 Hypoxemia: Secondary | ICD-10-CM | POA: Diagnosis not present

## 2017-07-02 DIAGNOSIS — R7989 Other specified abnormal findings of blood chemistry: Secondary | ICD-10-CM | POA: Diagnosis present

## 2017-07-02 DIAGNOSIS — R569 Unspecified convulsions: Secondary | ICD-10-CM | POA: Diagnosis present

## 2017-07-02 DIAGNOSIS — G8191 Hemiplegia, unspecified affecting right dominant side: Secondary | ICD-10-CM | POA: Diagnosis present

## 2017-07-02 DIAGNOSIS — Z7982 Long term (current) use of aspirin: Secondary | ICD-10-CM | POA: Diagnosis not present

## 2017-07-02 DIAGNOSIS — F329 Major depressive disorder, single episode, unspecified: Secondary | ICD-10-CM | POA: Diagnosis present

## 2017-07-02 DIAGNOSIS — Z79899 Other long term (current) drug therapy: Secondary | ICD-10-CM | POA: Diagnosis not present

## 2017-07-02 DIAGNOSIS — Z8673 Personal history of transient ischemic attack (TIA), and cerebral infarction without residual deficits: Secondary | ICD-10-CM | POA: Diagnosis not present

## 2017-07-02 DIAGNOSIS — W01198A Fall on same level from slipping, tripping and stumbling with subsequent striking against other object, initial encounter: Secondary | ICD-10-CM | POA: Diagnosis not present

## 2017-07-02 DIAGNOSIS — Z8249 Family history of ischemic heart disease and other diseases of the circulatory system: Secondary | ICD-10-CM | POA: Diagnosis not present

## 2017-07-02 DIAGNOSIS — S42414A Nondisplaced simple supracondylar fracture without intercondylar fracture of right humerus, initial encounter for closed fracture: Secondary | ICD-10-CM | POA: Diagnosis present

## 2017-07-02 DIAGNOSIS — G8111 Spastic hemiplegia affecting right dominant side: Secondary | ICD-10-CM | POA: Diagnosis not present

## 2017-07-02 DIAGNOSIS — F411 Generalized anxiety disorder: Secondary | ICD-10-CM | POA: Diagnosis present

## 2017-07-02 DIAGNOSIS — R0902 Hypoxemia: Secondary | ICD-10-CM | POA: Diagnosis present

## 2017-07-02 DIAGNOSIS — M6281 Muscle weakness (generalized): Secondary | ICD-10-CM | POA: Diagnosis not present

## 2017-07-02 DIAGNOSIS — J449 Chronic obstructive pulmonary disease, unspecified: Secondary | ICD-10-CM | POA: Diagnosis present

## 2017-07-02 DIAGNOSIS — Z79891 Long term (current) use of opiate analgesic: Secondary | ICD-10-CM | POA: Diagnosis not present

## 2017-07-02 DIAGNOSIS — S42411D Displaced simple supracondylar fracture without intercondylar fracture of right humerus, subsequent encounter for fracture with routine healing: Secondary | ICD-10-CM | POA: Diagnosis not present

## 2017-07-02 DIAGNOSIS — Z9181 History of falling: Secondary | ICD-10-CM | POA: Diagnosis not present

## 2017-07-02 DIAGNOSIS — J441 Chronic obstructive pulmonary disease with (acute) exacerbation: Secondary | ICD-10-CM | POA: Diagnosis not present

## 2017-07-02 DIAGNOSIS — E785 Hyperlipidemia, unspecified: Secondary | ICD-10-CM | POA: Diagnosis present

## 2017-07-02 DIAGNOSIS — X58XXXD Exposure to other specified factors, subsequent encounter: Secondary | ICD-10-CM | POA: Diagnosis not present

## 2017-07-02 DIAGNOSIS — F17200 Nicotine dependence, unspecified, uncomplicated: Secondary | ICD-10-CM | POA: Diagnosis present

## 2017-07-02 DIAGNOSIS — R278 Other lack of coordination: Secondary | ICD-10-CM | POA: Diagnosis not present

## 2017-07-06 DIAGNOSIS — S42414A Nondisplaced simple supracondylar fracture without intercondylar fracture of right humerus, initial encounter for closed fracture: Secondary | ICD-10-CM | POA: Diagnosis not present

## 2017-07-06 DIAGNOSIS — R278 Other lack of coordination: Secondary | ICD-10-CM | POA: Diagnosis not present

## 2017-07-06 DIAGNOSIS — F329 Major depressive disorder, single episode, unspecified: Secondary | ICD-10-CM | POA: Diagnosis not present

## 2017-07-06 DIAGNOSIS — Z9181 History of falling: Secondary | ICD-10-CM | POA: Diagnosis not present

## 2017-07-06 DIAGNOSIS — G8111 Spastic hemiplegia affecting right dominant side: Secondary | ICD-10-CM | POA: Diagnosis not present

## 2017-07-06 DIAGNOSIS — J449 Chronic obstructive pulmonary disease, unspecified: Secondary | ICD-10-CM | POA: Diagnosis not present

## 2017-07-06 DIAGNOSIS — F17200 Nicotine dependence, unspecified, uncomplicated: Secondary | ICD-10-CM | POA: Diagnosis not present

## 2017-07-06 DIAGNOSIS — S42401A Unspecified fracture of lower end of right humerus, initial encounter for closed fracture: Secondary | ICD-10-CM | POA: Diagnosis not present

## 2017-07-06 DIAGNOSIS — J441 Chronic obstructive pulmonary disease with (acute) exacerbation: Secondary | ICD-10-CM | POA: Diagnosis not present

## 2017-07-06 DIAGNOSIS — S42491D Other displaced fracture of lower end of right humerus, subsequent encounter for fracture with routine healing: Secondary | ICD-10-CM | POA: Diagnosis not present

## 2017-07-06 DIAGNOSIS — W01198A Fall on same level from slipping, tripping and stumbling with subsequent striking against other object, initial encounter: Secondary | ICD-10-CM | POA: Diagnosis not present

## 2017-07-06 DIAGNOSIS — R569 Unspecified convulsions: Secondary | ICD-10-CM | POA: Diagnosis not present

## 2017-07-06 DIAGNOSIS — M6281 Muscle weakness (generalized): Secondary | ICD-10-CM | POA: Diagnosis not present

## 2017-07-06 DIAGNOSIS — S42411D Displaced simple supracondylar fracture without intercondylar fracture of right humerus, subsequent encounter for fracture with routine healing: Secondary | ICD-10-CM | POA: Diagnosis not present

## 2017-07-06 DIAGNOSIS — F411 Generalized anxiety disorder: Secondary | ICD-10-CM | POA: Diagnosis not present

## 2017-07-06 DIAGNOSIS — E785 Hyperlipidemia, unspecified: Secondary | ICD-10-CM | POA: Diagnosis not present

## 2017-07-06 DIAGNOSIS — Z8673 Personal history of transient ischemic attack (TIA), and cerebral infarction without residual deficits: Secondary | ICD-10-CM | POA: Diagnosis not present

## 2017-07-06 DIAGNOSIS — X58XXXD Exposure to other specified factors, subsequent encounter: Secondary | ICD-10-CM | POA: Diagnosis not present

## 2017-08-10 DIAGNOSIS — S42491D Other displaced fracture of lower end of right humerus, subsequent encounter for fracture with routine healing: Secondary | ICD-10-CM | POA: Diagnosis not present

## 2017-08-10 DIAGNOSIS — S42401A Unspecified fracture of lower end of right humerus, initial encounter for closed fracture: Secondary | ICD-10-CM | POA: Diagnosis not present

## 2017-08-28 DIAGNOSIS — I6389 Other cerebral infarction: Secondary | ICD-10-CM | POA: Diagnosis not present

## 2017-08-28 DIAGNOSIS — G40509 Epileptic seizures related to external causes, not intractable, without status epilepticus: Secondary | ICD-10-CM | POA: Diagnosis not present

## 2017-08-28 DIAGNOSIS — F3289 Other specified depressive episodes: Secondary | ICD-10-CM | POA: Diagnosis not present

## 2017-08-28 DIAGNOSIS — I10 Essential (primary) hypertension: Secondary | ICD-10-CM | POA: Diagnosis not present

## 2017-08-28 DIAGNOSIS — E7849 Other hyperlipidemia: Secondary | ICD-10-CM | POA: Diagnosis not present

## 2017-11-26 DIAGNOSIS — R7303 Prediabetes: Secondary | ICD-10-CM | POA: Diagnosis not present

## 2017-11-26 DIAGNOSIS — I6389 Other cerebral infarction: Secondary | ICD-10-CM | POA: Diagnosis not present

## 2017-11-26 DIAGNOSIS — E7849 Other hyperlipidemia: Secondary | ICD-10-CM | POA: Diagnosis not present

## 2017-11-26 DIAGNOSIS — I10 Essential (primary) hypertension: Secondary | ICD-10-CM | POA: Diagnosis not present

## 2017-11-26 DIAGNOSIS — G40509 Epileptic seizures related to external causes, not intractable, without status epilepticus: Secondary | ICD-10-CM | POA: Diagnosis not present

## 2017-11-26 DIAGNOSIS — F3289 Other specified depressive episodes: Secondary | ICD-10-CM | POA: Diagnosis not present

## 2017-11-26 DIAGNOSIS — Z131 Encounter for screening for diabetes mellitus: Secondary | ICD-10-CM | POA: Diagnosis not present

## 2018-03-03 DIAGNOSIS — I10 Essential (primary) hypertension: Secondary | ICD-10-CM | POA: Diagnosis not present

## 2018-03-03 DIAGNOSIS — R05 Cough: Secondary | ICD-10-CM | POA: Diagnosis not present

## 2018-03-03 DIAGNOSIS — K047 Periapical abscess without sinus: Secondary | ICD-10-CM | POA: Diagnosis not present

## 2018-03-03 DIAGNOSIS — E7849 Other hyperlipidemia: Secondary | ICD-10-CM | POA: Diagnosis not present

## 2018-03-03 DIAGNOSIS — G40509 Epileptic seizures related to external causes, not intractable, without status epilepticus: Secondary | ICD-10-CM | POA: Diagnosis not present

## 2018-03-03 DIAGNOSIS — F3289 Other specified depressive episodes: Secondary | ICD-10-CM | POA: Diagnosis not present

## 2018-06-07 DIAGNOSIS — I10 Essential (primary) hypertension: Secondary | ICD-10-CM | POA: Diagnosis not present

## 2018-06-07 DIAGNOSIS — E7849 Other hyperlipidemia: Secondary | ICD-10-CM | POA: Diagnosis not present

## 2018-06-07 DIAGNOSIS — Z0000 Encounter for general adult medical examination without abnormal findings: Secondary | ICD-10-CM | POA: Diagnosis not present

## 2018-06-07 DIAGNOSIS — F3289 Other specified depressive episodes: Secondary | ICD-10-CM | POA: Diagnosis not present

## 2018-06-07 DIAGNOSIS — G40509 Epileptic seizures related to external causes, not intractable, without status epilepticus: Secondary | ICD-10-CM | POA: Diagnosis not present

## 2018-06-08 DIAGNOSIS — Z79899 Other long term (current) drug therapy: Secondary | ICD-10-CM | POA: Diagnosis not present

## 2018-06-08 DIAGNOSIS — S6991XA Unspecified injury of right wrist, hand and finger(s), initial encounter: Secondary | ICD-10-CM | POA: Diagnosis not present

## 2018-06-08 DIAGNOSIS — S60021A Contusion of right index finger without damage to nail, initial encounter: Secondary | ICD-10-CM | POA: Diagnosis not present

## 2018-06-08 DIAGNOSIS — M542 Cervicalgia: Secondary | ICD-10-CM | POA: Diagnosis not present

## 2018-06-08 DIAGNOSIS — G40909 Epilepsy, unspecified, not intractable, without status epilepticus: Secondary | ICD-10-CM | POA: Diagnosis not present

## 2018-06-08 DIAGNOSIS — R569 Unspecified convulsions: Secondary | ICD-10-CM | POA: Diagnosis not present

## 2018-06-08 DIAGNOSIS — S0990XA Unspecified injury of head, initial encounter: Secondary | ICD-10-CM | POA: Diagnosis not present

## 2018-06-08 DIAGNOSIS — Z7982 Long term (current) use of aspirin: Secondary | ICD-10-CM | POA: Diagnosis not present

## 2018-06-08 DIAGNOSIS — F17200 Nicotine dependence, unspecified, uncomplicated: Secondary | ICD-10-CM | POA: Diagnosis not present

## 2018-06-08 DIAGNOSIS — S0083XA Contusion of other part of head, initial encounter: Secondary | ICD-10-CM | POA: Diagnosis not present

## 2018-06-08 DIAGNOSIS — J449 Chronic obstructive pulmonary disease, unspecified: Secondary | ICD-10-CM | POA: Diagnosis not present

## 2018-06-08 DIAGNOSIS — S199XXA Unspecified injury of neck, initial encounter: Secondary | ICD-10-CM | POA: Diagnosis not present

## 2018-06-08 DIAGNOSIS — R51 Headache: Secondary | ICD-10-CM | POA: Diagnosis not present

## 2018-06-08 DIAGNOSIS — Z8673 Personal history of transient ischemic attack (TIA), and cerebral infarction without residual deficits: Secondary | ICD-10-CM | POA: Diagnosis not present

## 2018-06-08 DIAGNOSIS — W19XXXA Unspecified fall, initial encounter: Secondary | ICD-10-CM | POA: Diagnosis not present

## 2018-09-07 DIAGNOSIS — I6381 Other cerebral infarction due to occlusion or stenosis of small artery: Secondary | ICD-10-CM | POA: Diagnosis not present

## 2018-09-07 DIAGNOSIS — Z0000 Encounter for general adult medical examination without abnormal findings: Secondary | ICD-10-CM | POA: Diagnosis not present

## 2018-09-07 DIAGNOSIS — I10 Essential (primary) hypertension: Secondary | ICD-10-CM | POA: Diagnosis not present

## 2018-09-07 DIAGNOSIS — G40509 Epileptic seizures related to external causes, not intractable, without status epilepticus: Secondary | ICD-10-CM | POA: Diagnosis not present

## 2018-09-07 DIAGNOSIS — Z131 Encounter for screening for diabetes mellitus: Secondary | ICD-10-CM | POA: Diagnosis not present

## 2018-09-07 DIAGNOSIS — E7849 Other hyperlipidemia: Secondary | ICD-10-CM | POA: Diagnosis not present

## 2018-09-07 DIAGNOSIS — F3289 Other specified depressive episodes: Secondary | ICD-10-CM | POA: Diagnosis not present

## 2018-09-07 DIAGNOSIS — I6389 Other cerebral infarction: Secondary | ICD-10-CM | POA: Diagnosis not present

## 2018-09-26 DIAGNOSIS — S0001XA Abrasion of scalp, initial encounter: Secondary | ICD-10-CM | POA: Diagnosis not present

## 2018-09-26 DIAGNOSIS — S0000XA Unspecified superficial injury of scalp, initial encounter: Secondary | ICD-10-CM | POA: Diagnosis not present

## 2018-12-07 DIAGNOSIS — F3289 Other specified depressive episodes: Secondary | ICD-10-CM | POA: Diagnosis not present

## 2018-12-07 DIAGNOSIS — I6389 Other cerebral infarction: Secondary | ICD-10-CM | POA: Diagnosis not present

## 2018-12-07 DIAGNOSIS — G40509 Epileptic seizures related to external causes, not intractable, without status epilepticus: Secondary | ICD-10-CM | POA: Diagnosis not present

## 2018-12-07 DIAGNOSIS — E7849 Other hyperlipidemia: Secondary | ICD-10-CM | POA: Diagnosis not present

## 2018-12-07 DIAGNOSIS — I10 Essential (primary) hypertension: Secondary | ICD-10-CM | POA: Diagnosis not present

## 2019-03-16 DIAGNOSIS — G40509 Epileptic seizures related to external causes, not intractable, without status epilepticus: Secondary | ICD-10-CM | POA: Diagnosis not present

## 2019-03-16 DIAGNOSIS — F3289 Other specified depressive episodes: Secondary | ICD-10-CM | POA: Diagnosis not present

## 2019-03-16 DIAGNOSIS — I6389 Other cerebral infarction: Secondary | ICD-10-CM | POA: Diagnosis not present

## 2019-03-16 DIAGNOSIS — I10 Essential (primary) hypertension: Secondary | ICD-10-CM | POA: Diagnosis not present

## 2019-03-16 DIAGNOSIS — E7849 Other hyperlipidemia: Secondary | ICD-10-CM | POA: Diagnosis not present

## 2019-03-16 DIAGNOSIS — L609 Nail disorder, unspecified: Secondary | ICD-10-CM | POA: Diagnosis not present

## 2019-06-16 DIAGNOSIS — I10 Essential (primary) hypertension: Secondary | ICD-10-CM | POA: Diagnosis not present

## 2019-06-16 DIAGNOSIS — G40509 Epileptic seizures related to external causes, not intractable, without status epilepticus: Secondary | ICD-10-CM | POA: Diagnosis not present

## 2019-06-16 DIAGNOSIS — F3289 Other specified depressive episodes: Secondary | ICD-10-CM | POA: Diagnosis not present

## 2019-06-16 DIAGNOSIS — E7849 Other hyperlipidemia: Secondary | ICD-10-CM | POA: Diagnosis not present

## 2019-06-29 DIAGNOSIS — R51 Headache: Secondary | ICD-10-CM | POA: Diagnosis not present

## 2019-06-29 DIAGNOSIS — W01198A Fall on same level from slipping, tripping and stumbling with subsequent striking against other object, initial encounter: Secondary | ICD-10-CM | POA: Diagnosis not present

## 2019-06-29 DIAGNOSIS — S0101XA Laceration without foreign body of scalp, initial encounter: Secondary | ICD-10-CM | POA: Diagnosis not present

## 2019-06-29 DIAGNOSIS — Z79899 Other long term (current) drug therapy: Secondary | ICD-10-CM | POA: Diagnosis not present

## 2019-06-29 DIAGNOSIS — Z7982 Long term (current) use of aspirin: Secondary | ICD-10-CM | POA: Diagnosis not present

## 2019-06-29 DIAGNOSIS — Z87891 Personal history of nicotine dependence: Secondary | ICD-10-CM | POA: Diagnosis not present

## 2019-06-29 DIAGNOSIS — J449 Chronic obstructive pulmonary disease, unspecified: Secondary | ICD-10-CM | POA: Diagnosis not present

## 2019-06-29 DIAGNOSIS — Z8673 Personal history of transient ischemic attack (TIA), and cerebral infarction without residual deficits: Secondary | ICD-10-CM | POA: Diagnosis not present

## 2019-06-29 DIAGNOSIS — S0990XA Unspecified injury of head, initial encounter: Secondary | ICD-10-CM | POA: Diagnosis not present

## 2019-07-06 DIAGNOSIS — S0101XD Laceration without foreign body of scalp, subsequent encounter: Secondary | ICD-10-CM | POA: Diagnosis not present

## 2019-07-06 DIAGNOSIS — X58XXXD Exposure to other specified factors, subsequent encounter: Secondary | ICD-10-CM | POA: Diagnosis not present

## 2019-07-11 DIAGNOSIS — Z87891 Personal history of nicotine dependence: Secondary | ICD-10-CM | POA: Diagnosis not present

## 2019-07-11 DIAGNOSIS — S8991XA Unspecified injury of right lower leg, initial encounter: Secondary | ICD-10-CM | POA: Diagnosis not present

## 2019-07-11 DIAGNOSIS — M25551 Pain in right hip: Secondary | ICD-10-CM | POA: Diagnosis not present

## 2019-07-11 DIAGNOSIS — J449 Chronic obstructive pulmonary disease, unspecified: Secondary | ICD-10-CM | POA: Diagnosis not present

## 2019-07-11 DIAGNOSIS — M79651 Pain in right thigh: Secondary | ICD-10-CM | POA: Diagnosis not present

## 2019-07-11 DIAGNOSIS — W19XXXA Unspecified fall, initial encounter: Secondary | ICD-10-CM | POA: Diagnosis not present

## 2019-07-11 DIAGNOSIS — S79921A Unspecified injury of right thigh, initial encounter: Secondary | ICD-10-CM | POA: Diagnosis not present

## 2019-07-11 DIAGNOSIS — M25561 Pain in right knee: Secondary | ICD-10-CM | POA: Diagnosis not present

## 2019-07-11 DIAGNOSIS — S79911A Unspecified injury of right hip, initial encounter: Secondary | ICD-10-CM | POA: Diagnosis not present

## 2019-07-11 DIAGNOSIS — S72114A Nondisplaced fracture of greater trochanter of right femur, initial encounter for closed fracture: Secondary | ICD-10-CM | POA: Diagnosis not present

## 2019-09-20 DIAGNOSIS — I6389 Other cerebral infarction: Secondary | ICD-10-CM | POA: Diagnosis not present

## 2019-09-20 DIAGNOSIS — I10 Essential (primary) hypertension: Secondary | ICD-10-CM | POA: Diagnosis not present

## 2019-09-20 DIAGNOSIS — E7849 Other hyperlipidemia: Secondary | ICD-10-CM | POA: Diagnosis not present

## 2019-09-20 DIAGNOSIS — Z0000 Encounter for general adult medical examination without abnormal findings: Secondary | ICD-10-CM | POA: Diagnosis not present

## 2019-09-20 DIAGNOSIS — F3289 Other specified depressive episodes: Secondary | ICD-10-CM | POA: Diagnosis not present

## 2019-09-20 DIAGNOSIS — G40509 Epileptic seizures related to external causes, not intractable, without status epilepticus: Secondary | ICD-10-CM | POA: Diagnosis not present

## 2019-09-20 DIAGNOSIS — Z1389 Encounter for screening for other disorder: Secondary | ICD-10-CM | POA: Diagnosis not present

## 2020-01-18 DIAGNOSIS — F3289 Other specified depressive episodes: Secondary | ICD-10-CM | POA: Diagnosis not present

## 2020-01-18 DIAGNOSIS — I10 Essential (primary) hypertension: Secondary | ICD-10-CM | POA: Diagnosis not present

## 2020-01-18 DIAGNOSIS — I6381 Other cerebral infarction due to occlusion or stenosis of small artery: Secondary | ICD-10-CM | POA: Diagnosis not present

## 2020-01-18 DIAGNOSIS — E7849 Other hyperlipidemia: Secondary | ICD-10-CM | POA: Diagnosis not present

## 2020-01-18 DIAGNOSIS — G40509 Epileptic seizures related to external causes, not intractable, without status epilepticus: Secondary | ICD-10-CM | POA: Diagnosis not present

## 2020-06-05 DIAGNOSIS — I10 Essential (primary) hypertension: Secondary | ICD-10-CM | POA: Diagnosis not present

## 2020-06-05 DIAGNOSIS — Z8673 Personal history of transient ischemic attack (TIA), and cerebral infarction without residual deficits: Secondary | ICD-10-CM | POA: Diagnosis not present

## 2020-06-05 DIAGNOSIS — I6389 Other cerebral infarction: Secondary | ICD-10-CM | POA: Diagnosis not present

## 2020-06-05 DIAGNOSIS — E7849 Other hyperlipidemia: Secondary | ICD-10-CM | POA: Diagnosis not present

## 2020-06-05 DIAGNOSIS — F3289 Other specified depressive episodes: Secondary | ICD-10-CM | POA: Diagnosis not present

## 2020-06-05 DIAGNOSIS — G40509 Epileptic seizures related to external causes, not intractable, without status epilepticus: Secondary | ICD-10-CM | POA: Diagnosis not present

## 2020-09-26 DIAGNOSIS — Z8673 Personal history of transient ischemic attack (TIA), and cerebral infarction without residual deficits: Secondary | ICD-10-CM | POA: Diagnosis not present

## 2020-09-26 DIAGNOSIS — F3289 Other specified depressive episodes: Secondary | ICD-10-CM | POA: Diagnosis not present

## 2020-09-26 DIAGNOSIS — E7849 Other hyperlipidemia: Secondary | ICD-10-CM | POA: Diagnosis not present

## 2020-09-26 DIAGNOSIS — G40509 Epileptic seizures related to external causes, not intractable, without status epilepticus: Secondary | ICD-10-CM | POA: Diagnosis not present

## 2020-09-26 DIAGNOSIS — Z0000 Encounter for general adult medical examination without abnormal findings: Secondary | ICD-10-CM | POA: Diagnosis not present

## 2020-09-26 DIAGNOSIS — Z1331 Encounter for screening for depression: Secondary | ICD-10-CM | POA: Diagnosis not present

## 2020-09-26 DIAGNOSIS — I10 Essential (primary) hypertension: Secondary | ICD-10-CM | POA: Diagnosis not present

## 2020-10-09 ENCOUNTER — Encounter: Payer: Self-pay | Admitting: Internal Medicine

## 2020-10-18 ENCOUNTER — Encounter: Payer: Self-pay | Admitting: Nurse Practitioner

## 2020-11-27 ENCOUNTER — Ambulatory Visit: Payer: Self-pay | Admitting: Nurse Practitioner

## 2020-12-06 ENCOUNTER — Encounter: Payer: Self-pay | Admitting: Internal Medicine

## 2020-12-06 ENCOUNTER — Ambulatory Visit: Payer: Self-pay | Admitting: Nurse Practitioner

## 2021-02-20 DIAGNOSIS — F3289 Other specified depressive episodes: Secondary | ICD-10-CM | POA: Diagnosis not present

## 2021-02-20 DIAGNOSIS — I10 Essential (primary) hypertension: Secondary | ICD-10-CM | POA: Diagnosis not present

## 2021-02-20 DIAGNOSIS — G40509 Epileptic seizures related to external causes, not intractable, without status epilepticus: Secondary | ICD-10-CM | POA: Diagnosis not present

## 2021-02-20 DIAGNOSIS — Z8673 Personal history of transient ischemic attack (TIA), and cerebral infarction without residual deficits: Secondary | ICD-10-CM | POA: Diagnosis not present

## 2021-02-20 DIAGNOSIS — Z1331 Encounter for screening for depression: Secondary | ICD-10-CM | POA: Diagnosis not present

## 2021-02-20 DIAGNOSIS — E7849 Other hyperlipidemia: Secondary | ICD-10-CM | POA: Diagnosis not present

## 2021-02-20 DIAGNOSIS — Z0000 Encounter for general adult medical examination without abnormal findings: Secondary | ICD-10-CM | POA: Diagnosis not present

## 2021-05-07 DIAGNOSIS — L570 Actinic keratosis: Secondary | ICD-10-CM | POA: Diagnosis not present

## 2021-05-07 DIAGNOSIS — I10 Essential (primary) hypertension: Secondary | ICD-10-CM | POA: Diagnosis not present

## 2021-05-07 DIAGNOSIS — S0990XA Unspecified injury of head, initial encounter: Secondary | ICD-10-CM | POA: Diagnosis not present

## 2021-06-17 DIAGNOSIS — I69351 Hemiplegia and hemiparesis following cerebral infarction affecting right dominant side: Secondary | ICD-10-CM | POA: Diagnosis not present

## 2021-06-17 DIAGNOSIS — G40909 Epilepsy, unspecified, not intractable, without status epilepticus: Secondary | ICD-10-CM | POA: Diagnosis not present

## 2021-06-17 DIAGNOSIS — R2681 Unsteadiness on feet: Secondary | ICD-10-CM | POA: Diagnosis not present

## 2021-06-17 DIAGNOSIS — Z79899 Other long term (current) drug therapy: Secondary | ICD-10-CM | POA: Diagnosis not present

## 2021-06-17 DIAGNOSIS — S52612A Displaced fracture of left ulna styloid process, initial encounter for closed fracture: Secondary | ICD-10-CM | POA: Diagnosis not present

## 2021-06-17 DIAGNOSIS — S52572A Other intraarticular fracture of lower end of left radius, initial encounter for closed fracture: Secondary | ICD-10-CM | POA: Diagnosis not present

## 2021-06-17 DIAGNOSIS — I10 Essential (primary) hypertension: Secondary | ICD-10-CM | POA: Diagnosis not present

## 2021-06-17 DIAGNOSIS — S62102A Fracture of unspecified carpal bone, left wrist, initial encounter for closed fracture: Secondary | ICD-10-CM | POA: Diagnosis not present

## 2021-06-17 DIAGNOSIS — S52512A Displaced fracture of left radial styloid process, initial encounter for closed fracture: Secondary | ICD-10-CM | POA: Diagnosis not present

## 2021-06-17 DIAGNOSIS — W19XXXA Unspecified fall, initial encounter: Secondary | ICD-10-CM | POA: Diagnosis not present

## 2021-06-21 DIAGNOSIS — M25532 Pain in left wrist: Secondary | ICD-10-CM | POA: Diagnosis not present

## 2021-07-04 DIAGNOSIS — M25532 Pain in left wrist: Secondary | ICD-10-CM | POA: Diagnosis not present

## 2021-07-04 DIAGNOSIS — X58XXXA Exposure to other specified factors, initial encounter: Secondary | ICD-10-CM | POA: Diagnosis not present

## 2021-07-04 DIAGNOSIS — S52502A Unspecified fracture of the lower end of left radius, initial encounter for closed fracture: Secondary | ICD-10-CM | POA: Diagnosis not present

## 2021-07-15 DIAGNOSIS — M24541 Contracture, right hand: Secondary | ICD-10-CM | POA: Diagnosis not present

## 2021-07-15 DIAGNOSIS — F17210 Nicotine dependence, cigarettes, uncomplicated: Secondary | ICD-10-CM | POA: Diagnosis not present

## 2021-07-15 DIAGNOSIS — G40501 Epileptic seizures related to external causes, not intractable, with status epilepticus: Secondary | ICD-10-CM | POA: Diagnosis not present

## 2021-07-15 DIAGNOSIS — I10 Essential (primary) hypertension: Secondary | ICD-10-CM | POA: Diagnosis not present

## 2021-07-15 DIAGNOSIS — R45851 Suicidal ideations: Secondary | ICD-10-CM | POA: Diagnosis not present

## 2021-07-15 DIAGNOSIS — W19XXXA Unspecified fall, initial encounter: Secondary | ICD-10-CM | POA: Diagnosis not present

## 2021-07-15 DIAGNOSIS — L03113 Cellulitis of right upper limb: Secondary | ICD-10-CM | POA: Diagnosis not present

## 2021-07-15 DIAGNOSIS — I69398 Other sequelae of cerebral infarction: Secondary | ICD-10-CM | POA: Diagnosis not present

## 2021-07-15 DIAGNOSIS — S61421A Laceration with foreign body of right hand, initial encounter: Secondary | ICD-10-CM | POA: Diagnosis not present

## 2021-10-31 DIAGNOSIS — Z0000 Encounter for general adult medical examination without abnormal findings: Secondary | ICD-10-CM | POA: Diagnosis not present

## 2021-10-31 DIAGNOSIS — S0990XA Unspecified injury of head, initial encounter: Secondary | ICD-10-CM | POA: Diagnosis not present

## 2021-10-31 DIAGNOSIS — Z1331 Encounter for screening for depression: Secondary | ICD-10-CM | POA: Diagnosis not present

## 2021-10-31 DIAGNOSIS — E7849 Other hyperlipidemia: Secondary | ICD-10-CM | POA: Diagnosis not present

## 2021-10-31 DIAGNOSIS — L570 Actinic keratosis: Secondary | ICD-10-CM | POA: Diagnosis not present

## 2021-10-31 DIAGNOSIS — I10 Essential (primary) hypertension: Secondary | ICD-10-CM | POA: Diagnosis not present

## 2021-10-31 DIAGNOSIS — G40509 Epileptic seizures related to external causes, not intractable, without status epilepticus: Secondary | ICD-10-CM | POA: Diagnosis not present

## 2021-11-04 ENCOUNTER — Encounter: Payer: Self-pay | Admitting: *Deleted

## 2021-11-08 DIAGNOSIS — M25532 Pain in left wrist: Secondary | ICD-10-CM | POA: Diagnosis not present

## 2021-11-21 ENCOUNTER — Ambulatory Visit: Payer: Self-pay

## 2021-12-19 ENCOUNTER — Other Ambulatory Visit: Payer: Self-pay

## 2021-12-19 ENCOUNTER — Ambulatory Visit: Payer: Self-pay

## 2021-12-23 ENCOUNTER — Ambulatory Visit: Payer: Self-pay

## 2021-12-23 ENCOUNTER — Other Ambulatory Visit: Payer: Self-pay

## 2021-12-23 ENCOUNTER — Telehealth: Payer: Self-pay | Admitting: *Deleted

## 2021-12-23 ENCOUNTER — Encounter: Payer: Self-pay | Admitting: Internal Medicine

## 2021-12-23 NOTE — Telephone Encounter (Signed)
Tried to call pt x 2 times for nurse visit by telephone.  Voice mail not set up.

## 2022-05-29 DIAGNOSIS — I10 Essential (primary) hypertension: Secondary | ICD-10-CM | POA: Diagnosis not present

## 2022-05-29 DIAGNOSIS — L609 Nail disorder, unspecified: Secondary | ICD-10-CM | POA: Diagnosis not present

## 2022-05-29 DIAGNOSIS — G40509 Epileptic seizures related to external causes, not intractable, without status epilepticus: Secondary | ICD-10-CM | POA: Diagnosis not present

## 2022-05-29 DIAGNOSIS — E7849 Other hyperlipidemia: Secondary | ICD-10-CM | POA: Diagnosis not present

## 2022-11-18 DIAGNOSIS — I10 Essential (primary) hypertension: Secondary | ICD-10-CM | POA: Diagnosis not present

## 2022-11-18 DIAGNOSIS — E7849 Other hyperlipidemia: Secondary | ICD-10-CM | POA: Diagnosis not present

## 2022-11-18 DIAGNOSIS — L609 Nail disorder, unspecified: Secondary | ICD-10-CM | POA: Diagnosis not present

## 2022-11-18 DIAGNOSIS — G40509 Epileptic seizures related to external causes, not intractable, without status epilepticus: Secondary | ICD-10-CM | POA: Diagnosis not present

## 2023-07-04 DIAGNOSIS — I63512 Cerebral infarction due to unspecified occlusion or stenosis of left middle cerebral artery: Secondary | ICD-10-CM | POA: Diagnosis present

## 2023-07-04 DIAGNOSIS — I6782 Cerebral ischemia: Secondary | ICD-10-CM | POA: Diagnosis not present

## 2023-07-04 DIAGNOSIS — G40909 Epilepsy, unspecified, not intractable, without status epilepticus: Secondary | ICD-10-CM | POA: Diagnosis present

## 2023-07-04 DIAGNOSIS — I69328 Other speech and language deficits following cerebral infarction: Secondary | ICD-10-CM | POA: Diagnosis not present

## 2023-07-04 DIAGNOSIS — R29711 NIHSS score 11: Secondary | ICD-10-CM | POA: Diagnosis present

## 2023-07-04 DIAGNOSIS — I6031 Nontraumatic subarachnoid hemorrhage from right posterior communicating artery: Secondary | ICD-10-CM | POA: Diagnosis not present

## 2023-07-04 DIAGNOSIS — R29818 Other symptoms and signs involving the nervous system: Secondary | ICD-10-CM | POA: Diagnosis not present

## 2023-07-04 DIAGNOSIS — I671 Cerebral aneurysm, nonruptured: Secondary | ICD-10-CM | POA: Diagnosis not present

## 2023-07-04 DIAGNOSIS — I639 Cerebral infarction, unspecified: Secondary | ICD-10-CM | POA: Diagnosis not present

## 2023-07-04 DIAGNOSIS — Z7901 Long term (current) use of anticoagulants: Secondary | ICD-10-CM | POA: Diagnosis not present

## 2023-07-04 DIAGNOSIS — M25551 Pain in right hip: Secondary | ICD-10-CM | POA: Diagnosis not present

## 2023-07-04 DIAGNOSIS — I69351 Hemiplegia and hemiparesis following cerebral infarction affecting right dominant side: Secondary | ICD-10-CM | POA: Diagnosis not present

## 2023-07-04 DIAGNOSIS — Z7902 Long term (current) use of antithrombotics/antiplatelets: Secondary | ICD-10-CM | POA: Diagnosis not present

## 2023-07-04 DIAGNOSIS — I63412 Cerebral infarction due to embolism of left middle cerebral artery: Secondary | ICD-10-CM | POA: Diagnosis not present

## 2023-07-04 DIAGNOSIS — Z7982 Long term (current) use of aspirin: Secondary | ICD-10-CM | POA: Diagnosis not present

## 2023-07-04 DIAGNOSIS — Z791 Long term (current) use of non-steroidal anti-inflammatories (NSAID): Secondary | ICD-10-CM | POA: Diagnosis not present

## 2023-07-04 DIAGNOSIS — R29898 Other symptoms and signs involving the musculoskeletal system: Secondary | ICD-10-CM | POA: Diagnosis not present

## 2023-07-04 DIAGNOSIS — F17210 Nicotine dependence, cigarettes, uncomplicated: Secondary | ICD-10-CM | POA: Diagnosis present

## 2023-07-04 DIAGNOSIS — I6523 Occlusion and stenosis of bilateral carotid arteries: Secondary | ICD-10-CM | POA: Diagnosis not present

## 2023-07-04 DIAGNOSIS — Z79899 Other long term (current) drug therapy: Secondary | ICD-10-CM | POA: Diagnosis not present

## 2023-07-04 DIAGNOSIS — G9389 Other specified disorders of brain: Secondary | ICD-10-CM | POA: Diagnosis not present

## 2023-07-04 DIAGNOSIS — J449 Chronic obstructive pulmonary disease, unspecified: Secondary | ICD-10-CM | POA: Diagnosis present

## 2023-07-04 DIAGNOSIS — R531 Weakness: Secondary | ICD-10-CM | POA: Diagnosis not present

## 2023-07-04 DIAGNOSIS — E785 Hyperlipidemia, unspecified: Secondary | ICD-10-CM | POA: Diagnosis present

## 2023-07-04 DIAGNOSIS — I672 Cerebral atherosclerosis: Secondary | ICD-10-CM | POA: Diagnosis not present

## 2023-07-04 DIAGNOSIS — F17200 Nicotine dependence, unspecified, uncomplicated: Secondary | ICD-10-CM | POA: Diagnosis not present

## 2023-07-04 DIAGNOSIS — I10 Essential (primary) hypertension: Secondary | ICD-10-CM | POA: Diagnosis present

## 2023-07-05 DIAGNOSIS — F17210 Nicotine dependence, cigarettes, uncomplicated: Secondary | ICD-10-CM | POA: Diagnosis present

## 2023-07-05 DIAGNOSIS — Z791 Long term (current) use of non-steroidal anti-inflammatories (NSAID): Secondary | ICD-10-CM | POA: Diagnosis not present

## 2023-07-05 DIAGNOSIS — I69328 Other speech and language deficits following cerebral infarction: Secondary | ICD-10-CM | POA: Diagnosis not present

## 2023-07-05 DIAGNOSIS — G40909 Epilepsy, unspecified, not intractable, without status epilepticus: Secondary | ICD-10-CM | POA: Diagnosis present

## 2023-07-05 DIAGNOSIS — Z79899 Other long term (current) drug therapy: Secondary | ICD-10-CM | POA: Diagnosis not present

## 2023-07-05 DIAGNOSIS — R29898 Other symptoms and signs involving the musculoskeletal system: Secondary | ICD-10-CM | POA: Diagnosis not present

## 2023-07-05 DIAGNOSIS — F17200 Nicotine dependence, unspecified, uncomplicated: Secondary | ICD-10-CM | POA: Diagnosis not present

## 2023-07-05 DIAGNOSIS — I6031 Nontraumatic subarachnoid hemorrhage from right posterior communicating artery: Secondary | ICD-10-CM | POA: Diagnosis not present

## 2023-07-05 DIAGNOSIS — Z7901 Long term (current) use of anticoagulants: Secondary | ICD-10-CM | POA: Diagnosis not present

## 2023-07-05 DIAGNOSIS — I639 Cerebral infarction, unspecified: Secondary | ICD-10-CM | POA: Diagnosis not present

## 2023-07-05 DIAGNOSIS — J449 Chronic obstructive pulmonary disease, unspecified: Secondary | ICD-10-CM | POA: Diagnosis present

## 2023-07-05 DIAGNOSIS — E785 Hyperlipidemia, unspecified: Secondary | ICD-10-CM | POA: Diagnosis present

## 2023-07-05 DIAGNOSIS — I63412 Cerebral infarction due to embolism of left middle cerebral artery: Secondary | ICD-10-CM | POA: Diagnosis not present

## 2023-07-05 DIAGNOSIS — R29711 NIHSS score 11: Secondary | ICD-10-CM | POA: Diagnosis present

## 2023-07-05 DIAGNOSIS — R29818 Other symptoms and signs involving the nervous system: Secondary | ICD-10-CM | POA: Diagnosis not present

## 2023-07-05 DIAGNOSIS — Z7902 Long term (current) use of antithrombotics/antiplatelets: Secondary | ICD-10-CM | POA: Diagnosis not present

## 2023-07-05 DIAGNOSIS — M25551 Pain in right hip: Secondary | ICD-10-CM | POA: Diagnosis present

## 2023-07-05 DIAGNOSIS — Z7982 Long term (current) use of aspirin: Secondary | ICD-10-CM | POA: Diagnosis not present

## 2023-07-05 DIAGNOSIS — I10 Essential (primary) hypertension: Secondary | ICD-10-CM | POA: Diagnosis present

## 2023-07-05 DIAGNOSIS — I69351 Hemiplegia and hemiparesis following cerebral infarction affecting right dominant side: Secondary | ICD-10-CM | POA: Diagnosis not present

## 2023-07-05 DIAGNOSIS — I63512 Cerebral infarction due to unspecified occlusion or stenosis of left middle cerebral artery: Secondary | ICD-10-CM | POA: Diagnosis present

## 2024-01-29 DIAGNOSIS — M25551 Pain in right hip: Secondary | ICD-10-CM | POA: Diagnosis not present

## 2024-01-29 DIAGNOSIS — M1611 Unilateral primary osteoarthritis, right hip: Secondary | ICD-10-CM | POA: Diagnosis not present

## 2024-01-29 DIAGNOSIS — I10 Essential (primary) hypertension: Secondary | ICD-10-CM | POA: Diagnosis not present

## 2024-01-29 DIAGNOSIS — M79671 Pain in right foot: Secondary | ICD-10-CM | POA: Diagnosis not present

## 2024-01-29 DIAGNOSIS — Z7982 Long term (current) use of aspirin: Secondary | ICD-10-CM | POA: Diagnosis not present

## 2024-01-29 DIAGNOSIS — M25461 Effusion, right knee: Secondary | ICD-10-CM | POA: Diagnosis not present

## 2024-01-29 DIAGNOSIS — M19071 Primary osteoarthritis, right ankle and foot: Secondary | ICD-10-CM | POA: Diagnosis not present

## 2024-01-29 DIAGNOSIS — Z7902 Long term (current) use of antithrombotics/antiplatelets: Secondary | ICD-10-CM | POA: Diagnosis not present

## 2024-01-29 DIAGNOSIS — M47816 Spondylosis without myelopathy or radiculopathy, lumbar region: Secondary | ICD-10-CM | POA: Diagnosis not present

## 2024-01-29 DIAGNOSIS — M25561 Pain in right knee: Secondary | ICD-10-CM | POA: Diagnosis not present

## 2024-01-29 DIAGNOSIS — F17210 Nicotine dependence, cigarettes, uncomplicated: Secondary | ICD-10-CM | POA: Diagnosis not present

## 2024-01-29 DIAGNOSIS — S9031XA Contusion of right foot, initial encounter: Secondary | ICD-10-CM | POA: Diagnosis not present

## 2024-01-29 DIAGNOSIS — M7661 Achilles tendinitis, right leg: Secondary | ICD-10-CM | POA: Diagnosis not present

## 2024-01-29 DIAGNOSIS — W010XXA Fall on same level from slipping, tripping and stumbling without subsequent striking against object, initial encounter: Secondary | ICD-10-CM | POA: Diagnosis not present

## 2024-01-29 DIAGNOSIS — Z043 Encounter for examination and observation following other accident: Secondary | ICD-10-CM | POA: Diagnosis not present

## 2024-01-29 DIAGNOSIS — S8011XA Contusion of right lower leg, initial encounter: Secondary | ICD-10-CM | POA: Diagnosis not present

## 2024-01-29 DIAGNOSIS — M8588 Other specified disorders of bone density and structure, other site: Secondary | ICD-10-CM | POA: Diagnosis not present
